# Patient Record
Sex: Male | Born: 1955 | Race: White | Hispanic: No | Marital: Married | Smoking: Never smoker
Health system: Southern US, Community
[De-identification: ages and names within clinical notes are randomized; demographics above are authoritative.]

## PROBLEM LIST (undated history)

## (undated) DIAGNOSIS — E785 Hyperlipidemia, unspecified: Secondary | ICD-10-CM

## (undated) DIAGNOSIS — I8390 Asymptomatic varicose veins of unspecified lower extremity: Secondary | ICD-10-CM

## (undated) DIAGNOSIS — M1990 Unspecified osteoarthritis, unspecified site: Secondary | ICD-10-CM

## (undated) HISTORY — PX: CATARACT EXTRACTION: SUR2

## (undated) HISTORY — PX: REPLACEMENT TOTAL HIP W/  RESURFACING IMPLANTS: SUR1222

## (undated) HISTORY — DX: Unspecified osteoarthritis, unspecified site: M19.90

## (undated) HISTORY — PX: VASECTOMY: SHX75

## (undated) HISTORY — DX: Asymptomatic varicose veins of unspecified lower extremity: I83.90

## (undated) HISTORY — DX: Hyperlipidemia, unspecified: E78.5

---

## 2004-09-20 ENCOUNTER — Emergency Department (HOSPITAL_COMMUNITY): Admission: EM | Admit: 2004-09-20 | Discharge: 2004-09-20 | Payer: Self-pay | Admitting: Emergency Medicine

## 2009-01-19 ENCOUNTER — Telehealth (INDEPENDENT_AMBULATORY_CARE_PROVIDER_SITE_OTHER): Payer: Self-pay | Admitting: *Deleted

## 2009-02-01 ENCOUNTER — Ambulatory Visit: Payer: Self-pay | Admitting: Internal Medicine

## 2009-02-01 DIAGNOSIS — I8390 Asymptomatic varicose veins of unspecified lower extremity: Secondary | ICD-10-CM | POA: Insufficient documentation

## 2009-02-01 DIAGNOSIS — D485 Neoplasm of uncertain behavior of skin: Secondary | ICD-10-CM | POA: Insufficient documentation

## 2009-02-01 DIAGNOSIS — M79609 Pain in unspecified limb: Secondary | ICD-10-CM | POA: Insufficient documentation

## 2009-03-16 ENCOUNTER — Ambulatory Visit: Payer: Self-pay | Admitting: Vascular Surgery

## 2009-07-08 ENCOUNTER — Ambulatory Visit: Payer: Self-pay | Admitting: Vascular Surgery

## 2009-08-10 ENCOUNTER — Ambulatory Visit: Payer: Self-pay | Admitting: Vascular Surgery

## 2009-08-10 HISTORY — PX: ENDOVENOUS ABLATION SAPHENOUS VEIN W/ LASER: SUR449

## 2009-08-17 ENCOUNTER — Ambulatory Visit: Payer: Self-pay | Admitting: Vascular Surgery

## 2010-03-09 ENCOUNTER — Ambulatory Visit: Payer: Self-pay | Admitting: Internal Medicine

## 2010-03-09 ENCOUNTER — Telehealth (INDEPENDENT_AMBULATORY_CARE_PROVIDER_SITE_OTHER): Payer: Self-pay | Admitting: *Deleted

## 2010-03-23 ENCOUNTER — Encounter: Payer: Self-pay | Admitting: Internal Medicine

## 2010-03-24 ENCOUNTER — Encounter: Payer: Self-pay | Admitting: Internal Medicine

## 2010-03-24 ENCOUNTER — Ambulatory Visit: Payer: Self-pay

## 2010-12-06 NOTE — Assessment & Plan Note (Signed)
Summary: FU---STC   Vital Signs:  Patient profile:   55 year old male Height:      62 inches Weight:      224.75 pounds BMI:     41.26 O2 Sat:      98 % on Room air Temp:     97.6 degrees F oral Pulse rate:   62 / minute BP sitting:   112 / 66  (left arm) Cuff size:   large  Vitals Entered By: Lucious Groves (Mar 09, 2010 7:58 AM)  O2 Flow:  Room air CC: F/U./kb Is Patient Diabetic? No Pain Assessment Patient in pain? no        CC:  F/U./kb.  History of Present Illness: C/o L leg hurts at L knee and lat shin spasming, sharp 4/10  Current Medications (verified): 1)  None  Allergies (verified): No Known Drug Allergies  Past History:  Past Medical History: Last updated: 02/01/2009 Unremarkable  Social History: Last updated: 02/01/2009 Occupation: Sports administrator Married, 2 children Never Smoked Alcohol use-yes Regular exercise-no  Physical Exam  General:  Well-developed,well-nourished,in no acute distress; alert,appropriate and cooperative throughout examination Mouth:  Oral mucosa and oropharynx without lesions or exudates.  Teeth in good repair. Lungs:  Normal respiratory effort, chest expands symmetrically. Lungs are clear to auscultation, no crackles or wheezes. Heart:  Normal rate and regular rhythm. S1 and S2 normal without gallop, murmur, click, rub or other extra sounds. Msk:  L leg is tender below knee lat.; no mass Extremities:  No edema. L LE w/large varicosities on posterior calf Neurologic:  No cranial nerve deficits noted. Station and gait are normal. Plantar reflexes are down-going bilaterally. DTRs are symmetrical throughout. Sensory, motor and coordinative functions appear intact. Skin:  SKs in B axillas No rash Psych:  Cognition and judgment appear intact. Alert and cooperative with normal attention span and concentration. No apparent delusions, illusions, hallucinations   Impression & Recommendations:  Problem # 1:  LEG PAIN, LEFT  (ICD-729.5) likely MSK. R/o other etiol Assessment New  Orders: T-Tib/Fib Left (73590TC) Doppler Referral (Doppler)  Complete Medication List: 1)  Aspirin 81 Mg Tbec (Aspirin) .Marland Kitchen.. 1 by mouth qd 2)  Tramadol Hcl 50 Mg Tabs (Tramadol hcl) .Marland Kitchen.. 1-2 tabs by mouth two times a day as needed pain 3)  Naproxen 500 Mg Tabs (Naproxen) .Marland Kitchen.. 1 by mouth two times a day pc for pain/arthritis x 2 wks then prn 4)  Vitamin D 1000 Unit Tabs (Cholecalciferol) .Marland Kitchen.. 1 by mouth qd  Patient Instructions: 1)  Use stretching and balance exercises that I have provided (15 min. or longer every day) 2)  Please schedule a follow-up appointment in 6 weeks. Prescriptions: NAPROXEN 500 MG TABS (NAPROXEN) 1 by mouth two times a day pc for pain/arthritis x 2 wks then prn  #60 x 3   Entered and Authorized by:   Tresa Garter MD   Signed by:   Tresa Garter MD on 03/09/2010   Method used:   Print then Give to Patient   RxID:   [id number] TRAMADOL HCL 50 MG TABS (TRAMADOL HCL) 1-2 tabs by mouth two times a day as needed pain  #120 x 3   Entered and Authorized by:   Tresa Garter MD   Signed by:   Tresa Garter MD on 03/09/2010   Method used:   Print then Give to Patient   RxID:   [id number]

## 2010-12-06 NOTE — Progress Notes (Signed)
----   Converted from flag ---- ---- 03/09/2010 9:05 AM, Edman Circle wrote: appt 5/20 @ 12:30  ---- 03/09/2010 8:18 AM, Dagoberto Reef wrote: Thanks  ---- 03/09/2010 8:11 AM, Georgina Quint Plotnikov MD wrote: The following orders have been entered for this patient and placed on Admin Hold:  Type:     Referral       Code:   Doppler Description:   Doppler Referral Order Date:   03/09/2010   Authorized By:   Tresa Garter MD Order #:   [id number] Clinical Notes:   L leg pain x months ------------------------------

## 2010-12-06 NOTE — Miscellaneous (Signed)
Summary: Orders Update  Clinical Lists Changes  Orders: Added new Test order of Venous Duplex Lower Extremity (Venous Duplex Lower) - Signed 

## 2011-03-20 NOTE — Procedures (Signed)
LOWER EXTREMITY VENOUS REFLUX EXAM   INDICATION:  Left lower extremity varicose veins.   EXAM:  Using color-flow imaging and pulse Doppler spectral analysis, the  left common femoral, superficial femoral, popliteal, posterior tibial,  greater and lesser saphenous veins were evaluated.  There is evidence  suggesting deep venous insufficiency in the left common femoral vein.   The left saphenofemoral junction is not competent.  The left GSV is not  competent with calibers as described below.   The left proximal short saphenous vein demonstrates competency.   GSV Diameter (used if found to be incompetent only)                                            Right    Left  Proximal Greater Saphenous Vein           cm       0.72 cm  Proximal-to-mid-thigh                     cm       0.79 cm  Mid thigh                                 cm       cm  Mid-distal thigh                          cm       cm  Distal thigh                              cm       1.1 cm  Knee                                      cm       0.75 cm   IMPRESSION:  1. Left greater saphenous vein reflux is identified with the calibers      as described above and on the attached work sheet.  2. The left greater saphenous vein is not tortuous.  3. The deep venous system is not competent at the left common femoral      vein level.  4. The left lesser saphenous vein is competent.   ___________________________________________  Larina Earthly, M.D.   CH/MEDQ  D:  03/16/2009  T:  03/16/2009  Job:  [id number]

## 2011-03-20 NOTE — Assessment & Plan Note (Signed)
OFFICE VISIT   [name], Wayne L  DOB:  09/01/1956                                       08/10/2009  ZOXWR#:[id number]   Patient presented today for laser ablation of his left great saphenous  vein and multiple stab phlebectomies of the varicosity at the level of  his knee and calf.  He had no immediate complication and was discharged  home.  Will be seen in 1 week for ultrasound and an office follow-up.   Larina Earthly, M.D.  Electronically Signed   TFE/MEDQ  D:  08/10/2009  T:  08/11/2009  Job:  [id number]

## 2011-03-20 NOTE — Procedures (Signed)
DUPLEX DEEP VENOUS EXAM - LOWER EXTREMITY   INDICATION:  Followup of a left great saphenous vein EVLT.   HISTORY:  Edema:  Yes.  Trauma/Surgery:  EVLT.  Pain:  Yes.  PE:  No.  Previous DVT:  No.  Anticoagulants:  No.  Other:   DUPLEX EXAM:                CFV   SFV   PopV  PTV    GSV                R  L  R  L  R  L  R   L  R  L  Thrombosis    o  o     o     o      o     +  Spontaneous   +  +     +     +      +     o  Phasic        +  +     +     +      +     o  Augmentation  +  +     +     +      +     o  Compressible  +  +     +     +      +     o  Competent     +  +     +     +      +     o   Legend:  + - yes  o - no  p - partial  D - decreased   IMPRESSION:  Left great saphenous vein ablated from saphenofemoral  junction to knee, bulging varicosity in posterior calf partially  compressible below which connects to medial great saphenous vein and to  an incompetent perforator vein in the mid posterior calf measuring 0.67  cm.  There is no evidence of deep venous thrombosis.    _____________________________  Larina Earthly, M.D.   CJ/MEDQ  D:  08/17/2009  T:  08/17/2009  Job:  [id number]

## 2011-03-20 NOTE — Consult Note (Signed)
NEW PATIENT CONSULTATION   Wayne [name], Wayne [name]  DOB:  1955-11-14                                       03/16/2009  EAVWU#:[id number]   The patient presents today for evaluation of left leg venous  hypertension and pain associated with this.  He is a very pleasant  healthy 55 year old gentleman with a several year history of  varicosities in his left thigh and calf.  He reports that over the past  1-2 years he has began having increasing pain specifically over the  varicosities.  He reports that this is a dull achy sensation.  This is  worse with standing and he does have a heavy sensation in his left leg  from his calf distally.  He does have mild swelling in the level of his  ankle.  He does wear non-prescription strength compression with little  help.  He elevates his legs when possible as well.  His prior history is  unremarkable.  He does not have any history of cardiac or other major  medical difficulties.  He does not have hypertension, elevated  cholesterol, does not have diabetes.  He does have a family history of  premature atherosclerotic disease in mother.   SOCIAL HISTORY:  He is married with 2 children.  He does not smoke,  drink alcohol.   REVIEW OF SYSTEMS:  Otherwise negative.  He has no medications and has  no drug allergies.   PHYSICAL EXAMINATION:  He is a well-developed, well-nourished white male  appearing his stated age of 42.  Blood pressure is 101/65, pulse 78,  respirations 18.  His radial pulses are 2+ and he has 2+ dorsalis pedis  pulses bilaterally.  He does have significant varicose veins in his left  leg extending from his medial thigh down into his posterior calf.  He  does not have any skin changes in his ankle.   He underwent noninvasive vascular laboratory studies in our office and  this reveals reflux throughout his left great saphenous vein from the  groin distally, this does extend into the large varicosities in his  posterior calf.  There is communication with the small saphenous vein  but no evidence of reflux in the small saphenous vein..  I discussed  this at length with Wayne [name].  I explained the courses of  conservative treatment with elevation, compression and ibuprofen for  discomfort.  He is fitted with thigh-high 20-mm to 30-mm graduated  compression stockings today and is instructed on their daily use.  We  will see him again in 3 months for continued discussion.  I did explain  potential for laser ablation and stab phlebectomy for relief of  symptoms, should conservative methods fail.   Larina Earthly, M.D.  Electronically Signed   TFE/MEDQ  D:  03/16/2009  T:  03/17/2009  Job:  [id number]   cc:   Georgina Quint. Plotnikov, MD

## 2011-03-20 NOTE — Assessment & Plan Note (Signed)
OFFICE VISIT   Wayne [name], Wayne [name]  DOB:  1956/03/24                                       07/08/2009  UXLKG#:[id number]   The patient presents today for continued followup of his left leg venous  hypertension and varicose veins.  He has been wearing his compression  garments for 3 months and elevates his legs when possible and also takes  ibuprofen for discomfort.  He reports he has had no relief from the  pain.  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  He has prolonged standing and walking difficulty due to  leg pain.  He also runs for exercise and has had to stop running due to  leg pain and swelling.  He also has severe itching over the areas of  varicosities as well.   I discussed options with the patient.  I feel he has clearly failed  conservative treatment.  He does have reflux about his left great  saphenous vein extending into the varicosities.  I have recommended  laser ablation of his left great saphenous vein and stab phlebectomy of  tributary varicosities.  We will proceed with this at his convenience.   Larina Earthly, M.D.  Electronically Signed   TFE/MEDQ  D:  07/08/2009  T:  07/09/2009  Job:  [id number]

## 2011-03-20 NOTE — Assessment & Plan Note (Signed)
OFFICE VISIT   [name], Wayne L  DOB:  1955/12/15                                       08/17/2009  QIONG#:[id number]   The patient presents today for 1 week followup of left great saphenous  vein laser ablation and stab phlebectomy of multiple tributaries  varicosities in his calf and thigh.  He did well with the procedure and  has the usual amount of erythema and induration over the ablation in his  thigh.  His incisions all look quite good.  He underwent ultrasound  today and this reveals ablation of his great saphenous vein with no  evidence of DVT.  I am quite pleased with his initial result and plan to  see him again in 6 weeks for continued followup.   Larina Earthly, M.D.  Electronically Signed   TFE/MEDQ  D:  08/17/2009  T:  08/18/2009  Job:  [id number]   cc:   Georgina Quint. Plotnikov, MD

## 2011-06-26 ENCOUNTER — Other Ambulatory Visit: Payer: Self-pay | Admitting: *Deleted

## 2011-06-26 DIAGNOSIS — Z0389 Encounter for observation for other suspected diseases and conditions ruled out: Secondary | ICD-10-CM

## 2011-06-26 DIAGNOSIS — Z0000 Encounter for general adult medical examination without abnormal findings: Secondary | ICD-10-CM

## 2011-06-27 ENCOUNTER — Other Ambulatory Visit (INDEPENDENT_AMBULATORY_CARE_PROVIDER_SITE_OTHER): Payer: Managed Care, Other (non HMO)

## 2011-06-27 ENCOUNTER — Other Ambulatory Visit: Payer: Self-pay

## 2011-06-27 ENCOUNTER — Other Ambulatory Visit: Payer: Self-pay | Admitting: Internal Medicine

## 2011-06-27 DIAGNOSIS — Z0000 Encounter for general adult medical examination without abnormal findings: Secondary | ICD-10-CM

## 2011-06-27 DIAGNOSIS — Z0389 Encounter for observation for other suspected diseases and conditions ruled out: Secondary | ICD-10-CM

## 2011-06-27 LAB — CBC WITH DIFFERENTIAL/PLATELET
Basophils Relative: 0.6 % (ref 0.0–3.0)
Eosinophils Absolute: 0.2 10*3/uL (ref 0.0–0.7)
HCT: 41 % (ref 39.0–52.0)
Hemoglobin: 13.8 g/dL (ref 13.0–17.0)
Lymphs Abs: 1.6 10*3/uL (ref 0.7–4.0)
MCHC: 33.8 g/dL (ref 30.0–36.0)
MCV: 90.4 fl (ref 78.0–100.0)
Monocytes Absolute: 0.5 10*3/uL (ref 0.1–1.0)
Neutro Abs: 3.3 10*3/uL (ref 1.4–7.7)
RBC: 4.53 Mil/uL (ref 4.22–5.81)

## 2011-06-27 LAB — HEPATIC FUNCTION PANEL
AST: 23 U/L (ref 0–37)
Albumin: 4.4 g/dL (ref 3.5–5.2)
Alkaline Phosphatase: 62 U/L (ref 39–117)
Bilirubin, Direct: 0.1 mg/dL (ref 0.0–0.3)
Total Protein: 6.8 g/dL (ref 6.0–8.3)

## 2011-06-27 LAB — URINALYSIS, ROUTINE W REFLEX MICROSCOPIC
Bilirubin Urine: NEGATIVE
Hgb urine dipstick: NEGATIVE
Ketones, ur: NEGATIVE
Leukocytes, UA: NEGATIVE
Specific Gravity, Urine: 1.02 (ref 1.000–1.030)
Total Protein, Urine: NEGATIVE
Urine Glucose: NEGATIVE

## 2011-06-27 LAB — BASIC METABOLIC PANEL
CO2: 27 mEq/L (ref 19–32)
GFR: 125.17 mL/min (ref >60.00)
Glucose, Bld: 89 mg/dL (ref 70–99)
Potassium: 4.3 mEq/L (ref 3.5–5.1)
Sodium: 142 mEq/L (ref 135–145)

## 2011-07-04 ENCOUNTER — Ambulatory Visit (INDEPENDENT_AMBULATORY_CARE_PROVIDER_SITE_OTHER): Payer: Managed Care, Other (non HMO) | Admitting: Internal Medicine

## 2011-07-04 ENCOUNTER — Encounter: Payer: Self-pay | Admitting: Internal Medicine

## 2011-07-04 VITALS — BP 120/60 | HR 68 | Temp 98.5°F | Resp 16 | Ht 74.0 in | Wt 227.0 lb

## 2011-07-04 DIAGNOSIS — E785 Hyperlipidemia, unspecified: Secondary | ICD-10-CM

## 2011-07-04 DIAGNOSIS — Z0000 Encounter for general adult medical examination without abnormal findings: Secondary | ICD-10-CM

## 2011-07-04 NOTE — Assessment & Plan Note (Signed)
As above Colonosc, tDAP

## 2011-07-04 NOTE — Progress Notes (Signed)
Subjective:    Patient ID: Wayne [name], male    DOB: 1955/12/03, 55 y.o.   MRN: [id number]  HPI The patient is here for a wellness exam. The patient has been doing well overall without major physical or psychological issues going on lately.  BP Readings from Last 3 Encounters:  07/04/11 150/92  03/09/10 112/66  02/01/09 110/66   Wt Readings from Last 3 Encounters:  07/04/11 227 lb (102.967 kg)  03/09/10 224 lb 12 oz (101.946 kg)  02/01/09 210 lb (95.255 kg)       Review of Systems  Constitutional: Positive for unexpected weight change. Negative for appetite change and fatigue.  HENT: Negative for nosebleeds, congestion, sore throat, sneezing, trouble swallowing and neck pain.   Eyes: Negative for itching and visual disturbance.  Respiratory: Negative for cough.   Cardiovascular: Negative for chest pain, palpitations and leg swelling.  Gastrointestinal: Negative for nausea, diarrhea, blood in stool and abdominal distention.  Genitourinary: Negative for frequency and hematuria.  Musculoskeletal: Negative for back pain, joint swelling and gait problem.  Skin: Negative for rash.  Neurological: Negative for dizziness, tremors, speech difficulty and weakness.  Psychiatric/Behavioral: Negative for sleep disturbance, dysphoric mood and agitation. The patient is not nervous/anxious.        Objective:   Physical Exam  Constitutional: He is oriented to person, place, and time. He appears well-developed and well-nourished. No distress.  HENT:  Head: Normocephalic and atraumatic.  Right Ear: External ear normal.  Left Ear: External ear normal.  Nose: Nose normal.  Mouth/Throat: Oropharynx is clear and moist. No oropharyngeal exudate.  Eyes: Conjunctivae and EOM are normal. Pupils are equal, round, and reactive to light. Right eye exhibits no discharge. Left eye exhibits no discharge. No scleral icterus.  Neck: Normal range of motion. Neck supple. No JVD present. No tracheal  deviation present. No thyromegaly present.  Cardiovascular: Normal rate, regular rhythm, normal heart sounds and intact distal pulses.  Exam reveals no gallop and no friction rub.   No murmur heard. Pulmonary/Chest: Effort normal and breath sounds normal. No stridor. No respiratory distress. He has no wheezes. He has no rales. He exhibits no tenderness.  Abdominal: Soft. Bowel sounds are normal. He exhibits no distension and no mass. There is no tenderness. There is no rebound and no guarding.  Genitourinary: Rectum normal, prostate normal and penis normal. Guaiac negative stool. No penile tenderness.  Musculoskeletal: Normal range of motion. He exhibits no edema and no tenderness.  Lymphadenopathy:    He has no cervical adenopathy.  Neurological: He is alert and oriented to person, place, and time. He has normal reflexes. No cranial nerve deficit. He exhibits normal muscle tone. Coordination normal.  Skin: Skin is warm and dry. No rash noted. He is not diaphoretic. No erythema. No pallor.  Psychiatric: He has a normal mood and affect. His behavior is normal. Judgment and thought content normal.      Lab Results  Component Value Date   WBC 5.5 06/27/2011   HGB 13.8 06/27/2011   HCT 41.0 06/27/2011   PLT 220.0 06/27/2011   CHOL 215* 06/27/2011   TRIG 63.0 06/27/2011   HDL 44.70 06/27/2011   LDLDIRECT 162.7 06/27/2011   ALT 28 06/27/2011   AST 23 06/27/2011   NA 142 06/27/2011   K 4.3 06/27/2011   CL 107 06/27/2011   CREATININE 0.7 06/27/2011   BUN 13 06/27/2011   CO2 27 06/27/2011   TSH 1.58 06/27/2011   PSA 0.34 06/27/2011  Assessment & Plan:

## 2011-07-04 NOTE — Assessment & Plan Note (Signed)
Wt loss. Good diet

## 2011-09-11 ENCOUNTER — Encounter: Payer: Self-pay | Admitting: Gastroenterology

## 2012-05-20 ENCOUNTER — Encounter: Payer: Self-pay | Admitting: Internal Medicine

## 2012-05-20 ENCOUNTER — Ambulatory Visit (INDEPENDENT_AMBULATORY_CARE_PROVIDER_SITE_OTHER)
Admission: RE | Admit: 2012-05-20 | Discharge: 2012-05-20 | Disposition: A | Discharge status: Home / Self Care | Payer: BC Managed Care – PPO | Source: Ambulatory Visit | Attending: Internal Medicine | Admitting: Internal Medicine

## 2012-05-20 ENCOUNTER — Ambulatory Visit (INDEPENDENT_AMBULATORY_CARE_PROVIDER_SITE_OTHER): Payer: BC Managed Care – PPO | Admitting: Internal Medicine

## 2012-05-20 VITALS — BP 120/80 | HR 80 | Temp 98.2°F | Resp 16 | Wt 227.0 lb

## 2012-05-20 DIAGNOSIS — M542 Cervicalgia: Secondary | ICD-10-CM

## 2012-05-20 MED ORDER — TRAMADOL HCL 50 MG PO TABS: 50.0000 mg | ORAL_TABLET | Freq: Two times a day (BID) | ORAL | Status: AC | PRN

## 2012-05-20 MED ORDER — IBUPROFEN 600 MG PO TABS: ORAL_TABLET | ORAL | Status: AC

## 2012-05-20 NOTE — Patient Instructions (Addendum)
Contour pillow  

## 2012-05-20 NOTE — Assessment & Plan Note (Addendum)
X ray See meds ROM exercises

## 2012-05-21 ENCOUNTER — Telehealth: Payer: Self-pay | Admitting: Internal Medicine

## 2012-05-21 NOTE — Telephone Encounter (Signed)
Left detailed mess informing pt of below.  

## 2012-05-21 NOTE — Progress Notes (Signed)
Subjective:    Patient ID: Wayne [name], male    DOB: 1956-06-04, 56 y.o.   MRN: [id number]  Neck Pain  This is a new problem. The current episode started 1 to 4 weeks ago. The problem occurs daily. The problem has been gradually worsening. The pain is associated with a sleep position. Pain location: B sides. The quality of the pain is described as stabbing and aching. The pain is at a severity of 5/10. The pain is moderate. The symptoms are aggravated by position. Pertinent negatives include no chest pain, fever, trouble swallowing or weakness. He has tried acetaminophen and NSAIDs for the symptoms. The treatment provided mild relief.     BP Readings from Last 3 Encounters:  05/20/12 120/80  07/04/11 120/60  03/09/10 112/66   Wt Readings from Last 3 Encounters:  05/20/12 227 lb (102.967 kg)  07/04/11 227 lb (102.967 kg)  03/09/10 224 lb 12 oz (101.946 kg)       Review of Systems  Constitutional: Positive for unexpected weight change. Negative for fever, appetite change and fatigue.  HENT: Positive for neck pain. Negative for nosebleeds, congestion, sore throat, sneezing and trouble swallowing.   Eyes: Negative for itching and visual disturbance.  Respiratory: Negative for cough.   Cardiovascular: Negative for chest pain, palpitations and leg swelling.  Gastrointestinal: Negative for nausea, diarrhea, blood in stool and abdominal distention.  Genitourinary: Negative for frequency and hematuria.  Musculoskeletal: Negative for back pain, joint swelling and gait problem.  Skin: Negative for rash.  Neurological: Negative for dizziness, tremors, speech difficulty and weakness.  Psychiatric/Behavioral: Negative for disturbed wake/sleep cycle, dysphoric mood and agitation. The patient is not nervous/anxious.        Objective:   Physical Exam  Constitutional: He is oriented to person, place, and time. He appears well-developed and well-nourished. No distress.  HENT:  Head:  Normocephalic and atraumatic.  Right Ear: External ear normal.  Left Ear: External ear normal.  Nose: Nose normal.  Mouth/Throat: Oropharynx is clear and moist. No oropharyngeal exudate.  Eyes: Conjunctivae and EOM are normal. Pupils are equal, round, and reactive to light. Right eye exhibits no discharge. Left eye exhibits no discharge. No scleral icterus.  Neck: Normal range of motion. Neck supple. No JVD present. No tracheal deviation present. No thyromegaly present.  Cardiovascular: Normal rate, regular rhythm, normal heart sounds and intact distal pulses.  Exam reveals no gallop and no friction rub.   No murmur heard. Pulmonary/Chest: Effort normal and breath sounds normal. No stridor. No respiratory distress. He has no wheezes. He has no rales. He exhibits no tenderness.  Abdominal: Soft. Bowel sounds are normal. He exhibits no distension and no mass. There is no tenderness. There is no rebound and no guarding.  Genitourinary: Rectum normal, prostate normal and penis normal. Guaiac negative stool. No penile tenderness.  Musculoskeletal: Normal range of motion. He exhibits no edema and no tenderness.  Lymphadenopathy:    He has no cervical adenopathy.  Neurological: He is alert and oriented to person, place, and time. He has normal reflexes. No cranial nerve deficit. He exhibits normal muscle tone. Coordination normal.  Skin: Skin is warm and dry. No rash noted. He is not diaphoretic. No erythema. No pallor.  Psychiatric: He has a normal mood and affect. His behavior is normal. Judgment and thought content normal.  B traps are tender    Lab Results  Component Value Date   WBC 5.5 06/27/2011   HGB 13.8 06/27/2011   HCT  41.0 06/27/2011   PLT 220.0 06/27/2011   CHOL 215* 06/27/2011   TRIG 63.0 06/27/2011   HDL 44.70 06/27/2011   LDLDIRECT 162.7 06/27/2011   ALT 28 06/27/2011   AST 23 06/27/2011   NA 142 06/27/2011   K 4.3 06/27/2011   CL 107 06/27/2011   CREATININE 0.7 06/27/2011   BUN 13  06/27/2011   CO2 27 06/27/2011   TSH 1.58 06/27/2011   PSA 0.34 06/27/2011       Assessment & Plan:

## 2012-05-21 NOTE — Telephone Encounter (Signed)
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 Rx as we discussed Thx

## 2012-10-17 ENCOUNTER — Ambulatory Visit (INDEPENDENT_AMBULATORY_CARE_PROVIDER_SITE_OTHER)
Admission: RE | Admit: 2012-10-17 | Discharge: 2012-10-17 | Disposition: A | Discharge status: Home / Self Care | Payer: BC Managed Care – PPO | Source: Ambulatory Visit | Attending: Internal Medicine | Admitting: Internal Medicine

## 2012-10-17 ENCOUNTER — Ambulatory Visit (INDEPENDENT_AMBULATORY_CARE_PROVIDER_SITE_OTHER): Payer: BC Managed Care – PPO | Admitting: Internal Medicine

## 2012-10-17 ENCOUNTER — Encounter: Payer: Self-pay | Admitting: Internal Medicine

## 2012-10-17 VITALS — BP 138/78 | HR 76 | Temp 97.5°F | Resp 16 | Wt 229.0 lb

## 2012-10-17 DIAGNOSIS — M79609 Pain in unspecified limb: Secondary | ICD-10-CM

## 2012-10-17 DIAGNOSIS — M542 Cervicalgia: Secondary | ICD-10-CM

## 2012-10-17 MED ORDER — IBUPROFEN 600 MG PO TABS: 600.0000 mg | ORAL_TABLET | Freq: Three times a day (TID) | ORAL | Status: DC | PRN

## 2012-10-17 NOTE — Assessment & Plan Note (Signed)
Continue with current prescription therapy as reflected on the Med list.  

## 2012-10-17 NOTE — Progress Notes (Signed)
Subjective:    Patient ID: Wayne [name], male    DOB: [birth year], 56 y.o.   MRN: [id number]  Leg Pain  There was no injury mechanism. The pain is present in the left leg, left knee and left thigh. The pain is moderate. Pertinent negatives include no loss of motion or muscle weakness. Exacerbated by: at night. The treatment provided mild relief.  Neck Pain  This is a new problem. The current episode started 1 to 4 weeks ago. The problem occurs daily. The problem has been gradually worsening. The pain is associated with a sleep position. Pain location: B sides. The quality of the pain is described as stabbing and aching. The pain is at a severity of 5/10. The pain is mild. The symptoms are aggravated by position. Associated symptoms include leg pain. Pertinent negatives include no chest pain, fever, trouble swallowing or weakness. He has tried acetaminophen and NSAIDs for the symptoms. The treatment provided mild relief.     BP Readings from Last 3 Encounters:  10/17/12 138/78  05/20/12 120/80  07/04/11 120/60   Wt Readings from Last 3 Encounters:  10/17/12 229 lb (103.874 kg)  05/20/12 227 lb (102.967 kg)  07/04/11 227 lb (102.967 kg)       Review of Systems  Constitutional: Positive for unexpected weight change. Negative for fever, appetite change and fatigue.  HENT: Positive for neck pain. Negative for nosebleeds, congestion, sore throat, sneezing and trouble swallowing.   Eyes: Negative for itching and visual disturbance.  Respiratory: Negative for cough.   Cardiovascular: Negative for chest pain, palpitations and leg swelling.  Gastrointestinal: Negative for nausea, diarrhea, blood in stool and abdominal distention.  Genitourinary: Negative for frequency and hematuria.  Musculoskeletal: Negative for back pain, joint swelling and gait problem.  Skin: Negative for rash.  Neurological: Negative for dizziness, tremors, speech difficulty and weakness.  Psychiatric/Behavioral:  Negative for sleep disturbance, dysphoric mood and agitation. The patient is not nervous/anxious.        Objective:   Physical Exam  Constitutional: He is oriented to person, place, and time. He appears well-developed and well-nourished. No distress.  HENT:  Head: Normocephalic and atraumatic.  Right Ear: External ear normal.  Left Ear: External ear normal.  Nose: Nose normal.  Mouth/Throat: Oropharynx is clear and moist. No oropharyngeal exudate.  Eyes: Conjunctivae normal and EOM are normal. Pupils are equal, round, and reactive to light. Right eye exhibits no discharge. Left eye exhibits no discharge. No scleral icterus.  Neck: Normal range of motion. Neck supple. No JVD present. No tracheal deviation present. No thyromegaly present.  Cardiovascular: Normal rate, regular rhythm, normal heart sounds and intact distal pulses.  Exam reveals no gallop and no friction rub.   No murmur heard. Pulmonary/Chest: Effort normal and breath sounds normal. No stridor. No respiratory distress. He has no wheezes. He has no rales. He exhibits no tenderness.  Abdominal: Soft. Bowel sounds are normal. He exhibits no distension and no mass. There is no tenderness. There is no rebound and no guarding.  Genitourinary: Rectum normal, prostate normal and penis normal. Guaiac negative stool. No penile tenderness.  Musculoskeletal: Normal range of motion. He exhibits no edema and no tenderness.  Lymphadenopathy:    He has no cervical adenopathy.  Neurological: He is alert and oriented to person, place, and time. He has normal reflexes. No cranial nerve deficit. He exhibits normal muscle tone. Coordination normal.  Skin: Skin is warm and dry. No rash noted. He is not diaphoretic. No  erythema. No pallor.  Psychiatric: He has a normal mood and affect. His behavior is normal. Judgment and thought content normal.  B traps are tender Tight hamstrings B    Lab Results  Component Value Date   WBC 5.5 06/27/2011    HGB 13.8 06/27/2011   HCT 41.0 06/27/2011   PLT 220.0 06/27/2011   CHOL 215* 06/27/2011   TRIG 63.0 06/27/2011   HDL 44.70 06/27/2011   LDLDIRECT 162.7 06/27/2011   ALT 28 06/27/2011   AST 23 06/27/2011   NA 142 06/27/2011   K 4.3 06/27/2011   CL 107 06/27/2011   CREATININE 0.7 06/27/2011   BUN 13 06/27/2011   CO2 27 06/27/2011   TSH 1.58 06/27/2011   PSA 0.34 06/27/2011       Assessment & Plan:

## 2012-10-17 NOTE — Assessment & Plan Note (Signed)
12/13 -- ?tight hamstring L knee Xray Stretch

## 2013-02-20 ENCOUNTER — Encounter: Payer: Self-pay | Admitting: Internal Medicine

## 2013-02-20 ENCOUNTER — Ambulatory Visit (INDEPENDENT_AMBULATORY_CARE_PROVIDER_SITE_OTHER): Payer: BC Managed Care – PPO | Admitting: Internal Medicine

## 2013-02-20 VITALS — BP 120/70 | Temp 97.7°F | Wt 229.0 lb

## 2013-02-20 DIAGNOSIS — I8390 Asymptomatic varicose veins of unspecified lower extremity: Secondary | ICD-10-CM

## 2013-02-20 DIAGNOSIS — I8392 Asymptomatic varicose veins of left lower extremity: Secondary | ICD-10-CM

## 2013-02-20 DIAGNOSIS — Z0000 Encounter for general adult medical examination without abnormal findings: Secondary | ICD-10-CM

## 2013-02-20 DIAGNOSIS — L089 Local infection of the skin and subcutaneous tissue, unspecified: Secondary | ICD-10-CM | POA: Insufficient documentation

## 2013-02-20 DIAGNOSIS — M79609 Pain in unspecified limb: Secondary | ICD-10-CM

## 2013-02-20 DIAGNOSIS — M542 Cervicalgia: Secondary | ICD-10-CM

## 2013-02-20 DIAGNOSIS — L723 Sebaceous cyst: Secondary | ICD-10-CM | POA: Insufficient documentation

## 2013-02-20 DIAGNOSIS — L219 Seborrheic dermatitis, unspecified: Secondary | ICD-10-CM | POA: Insufficient documentation

## 2013-02-20 MED ORDER — IBUPROFEN 600 MG PO TABS: 600.0000 mg | ORAL_TABLET | Freq: Three times a day (TID) | ORAL | Status: DC | PRN

## 2013-02-20 MED ORDER — DOXYCYCLINE HYCLATE 100 MG PO TABS: 100.0000 mg | ORAL_TABLET | Freq: Two times a day (BID) | ORAL | Status: DC

## 2013-02-20 MED ORDER — CLOTRIMAZOLE-BETAMETHASONE 1-0.05 % EX CREA: TOPICAL_CREAM | Freq: Two times a day (BID) | CUTANEOUS | Status: DC

## 2013-02-20 NOTE — Assessment & Plan Note (Signed)
4/13 Varic veins relapse (?) Dr Arbie Cookey

## 2013-02-20 NOTE — Assessment & Plan Note (Signed)
Doxy

## 2013-02-20 NOTE — Progress Notes (Signed)
Subjective:    Patient ID: Wayne [name], male    DOB: [birth year], 57 y.o.   MRN: [id number]  Leg Pain  There was no injury mechanism. The pain is present in the left leg, left knee and left thigh. The pain is moderate. Pertinent negatives include no loss of motion or muscle weakness. Exacerbated by: at night. The treatment provided mild relief.   C/o rash on face C/o skin swelling - ?cyst  BP Readings from Last 3 Encounters:  02/20/13 120/70  10/17/12 138/78  05/20/12 120/80   Wt Readings from Last 3 Encounters:  02/20/13 229 lb (103.874 kg)  10/17/12 229 lb (103.874 kg)  05/20/12 227 lb (102.967 kg)       Review of Systems  Constitutional: Positive for unexpected weight change. Negative for appetite change and fatigue.  HENT: Negative for nosebleeds, congestion, sore throat and sneezing.   Eyes: Negative for itching and visual disturbance.  Respiratory: Negative for cough.   Cardiovascular: Negative for palpitations and leg swelling.  Gastrointestinal: Negative for nausea, diarrhea, blood in stool and abdominal distention.  Genitourinary: Negative for frequency and hematuria.  Musculoskeletal: Negative for back pain, joint swelling and gait problem.  Skin: Negative for rash.  Neurological: Negative for dizziness, tremors and speech difficulty.  Psychiatric/Behavioral: Negative for sleep disturbance, dysphoric mood and agitation. The patient is not nervous/anxious.        Objective:   Physical Exam  Constitutional: He is oriented to person, place, and time. He appears well-developed and well-nourished. No distress.  HENT:  Head: Normocephalic and atraumatic.  Right Ear: External ear normal.  Left Ear: External ear normal.  Nose: Nose normal.  Mouth/Throat: Oropharynx is clear and moist. No oropharyngeal exudate.  Eyes: Conjunctivae and EOM are normal. Pupils are equal, round, and reactive to light. Right eye exhibits no discharge. Left eye exhibits no discharge. No  scleral icterus.  Neck: Normal range of motion. Neck supple. No JVD present. No tracheal deviation present. No thyromegaly present.  Cardiovascular: Normal rate, regular rhythm, normal heart sounds and intact distal pulses.  Exam reveals no gallop and no friction rub.   No murmur heard. Pulmonary/Chest: Effort normal and breath sounds normal. No stridor. No respiratory distress. He has no wheezes. He has no rales. He exhibits no tenderness.  Abdominal: Soft. Bowel sounds are normal. He exhibits no distension and no mass. There is no tenderness. There is no rebound and no guarding.  Genitourinary: Rectum normal, prostate normal and penis normal. Guaiac negative stool. No penile tenderness.  Musculoskeletal: Normal range of motion. He exhibits no edema and no tenderness.  Lymphadenopathy:    He has no cervical adenopathy.  Neurological: He is alert and oriented to person, place, and time. He has normal reflexes. No cranial nerve deficit. He exhibits normal muscle tone. Coordination normal.  Skin: Skin is warm and dry. No rash noted. He is not diaphoretic. No erythema. No pallor.  Psychiatric: He has a normal mood and affect. His behavior is normal. Judgment and thought content normal.  B traps are tender Tight hamstrings B    Lab Results  Component Value Date   WBC 5.5 06/27/2011   HGB 13.8 06/27/2011   HCT 41.0 06/27/2011   PLT 220.0 06/27/2011   CHOL 215* 06/27/2011   TRIG 63.0 06/27/2011   HDL 44.70 06/27/2011   LDLDIRECT 162.7 06/27/2011   ALT 28 06/27/2011   AST 23 06/27/2011   NA 142 06/27/2011   K 4.3 06/27/2011   CL 107  06/27/2011   CREATININE 0.7 06/27/2011   BUN 13 06/27/2011   CO2 27 06/27/2011   TSH 1.58 06/27/2011   PSA 0.34 06/27/2011       Assessment & Plan:

## 2013-02-20 NOTE — Assessment & Plan Note (Signed)
Lotrisone prn 

## 2013-02-20 NOTE — Assessment & Plan Note (Signed)
Better  

## 2013-02-20 NOTE — Assessment & Plan Note (Signed)
Will consult Dr Arbie Cookey

## 2013-02-22 ENCOUNTER — Encounter: Payer: Self-pay | Admitting: Internal Medicine

## 2013-03-02 ENCOUNTER — Encounter: Payer: Self-pay | Admitting: Internal Medicine

## 2013-03-02 ENCOUNTER — Ambulatory Visit (INDEPENDENT_AMBULATORY_CARE_PROVIDER_SITE_OTHER): Payer: BC Managed Care – PPO | Admitting: Internal Medicine

## 2013-03-02 VITALS — BP 130/70 | HR 65 | Temp 97.4°F | Wt 227.0 lb

## 2013-03-02 DIAGNOSIS — R1310 Dysphagia, unspecified: Secondary | ICD-10-CM

## 2013-03-02 DIAGNOSIS — J029 Acute pharyngitis, unspecified: Secondary | ICD-10-CM

## 2013-03-02 DIAGNOSIS — F458 Other somatoform disorders: Secondary | ICD-10-CM

## 2013-03-02 DIAGNOSIS — R198 Other specified symptoms and signs involving the digestive system and abdomen: Secondary | ICD-10-CM

## 2013-03-02 DIAGNOSIS — R0989 Other specified symptoms and signs involving the circulatory and respiratory systems: Secondary | ICD-10-CM

## 2013-03-02 NOTE — Progress Notes (Signed)
HPI  Pt presents to the clinic today with c/o sore throat and difficulty swallowing. He was seen 2 weeks ago by Dr. Posey Rea and given doxycycline for the sore throat. He does have a few pills left but her reports the sore throat is not any better. He has not taking anything over the counter. He does feel like food gets stuck when he tries to swallow. He has never had trouble swallowing in the past. He has never had a upper GI scope or a colonoscopy. He has no family history of throat/mouth cancer. He does not smoke.  Review of Systems     History reviewed. No pertinent past medical history.  Family History  Problem Relation Age of Onset  . Diabetes Father   . Hypertension Mother     History   Social History  . Marital Status: Married    Spouse Name: N/A    Number of Children: 2  . Years of Education: N/A   Occupational History  . Not on file.   Social History Main Topics  . Smoking status: Never Smoker   . Smokeless tobacco: Not on file  . Alcohol Use: 1.8 oz/week    3 Cans of beer per week  . Drug Use: No  . Sexually Active: Yes   Other Topics Concern  . Not on file   Social History Narrative   Regular Exercise- no    No Known Allergies   Constitutional:  Denies headache, fatigue, fever or abrupt weight changes.  HEENT:  Positive sore throat. Denies eye redness, eye pain, pressure behind the eyes, facial pain, nasal congestion, ear pain, ringing in the ears, wax buildup, runny nose or bloody nose. Respiratory:  Denies cough, difficulty breathing or shortness of breath.  Cardiovascular: Denies chest pain, chest tightness, palpitations or swelling in the hands or feet.   No other specific complaints in a complete review of systems (except as listed in HPI above).  Objective:   BP 130/70  Pulse 65  Temp(Src) 97.4 F (36.3 C) (Oral)  Wt 227 lb (102.967 kg)  BMI 29.13 kg/m2  SpO2 97% Wt Readings from Last 3 Encounters:  03/02/13 227 lb (102.967 kg)   02/20/13 229 lb (103.874 kg)  10/17/12 229 lb (103.874 kg)     General: Appears his stated age, well developed, well nourished in NAD. HEENT: Head: normal shape and size; Eyes: sclera white, no icterus, conjunctiva pink, PERRLA and EOMs intact; Ears: Tm's gray and intact, normal light reflex; Nose: mucosa pink and moist, septum midline; Throat/Mouth: Teeth present, mucosa pink and moist, no exudate noted, no lesions or ulcerations noted.  Neck:  Neck supple, trachea midline. No massses, lumps or thyromegaly present.  Cardiovascular: Normal rate and rhythm. S1,S2 noted.  No murmur, rubs or gallops noted. No JVD or BLE edema. No carotid bruits noted. Pulmonary/Chest: Normal effort and positive vesicular breath sounds. No respiratory distress. No wheezes, rales or ronchi noted.      Assessment & Plan:   Sore throat with globus sensation and dysphagia, new onset:  No sign of bacterial infection to treat Nothing obvious stuck in the throat Will refer to GI for possible scope  RTC as needed or if symptoms persist.

## 2013-03-02 NOTE — Patient Instructions (Signed)

## 2013-03-05 ENCOUNTER — Other Ambulatory Visit: Payer: Self-pay | Admitting: *Deleted

## 2013-03-05 ENCOUNTER — Telehealth: Payer: Self-pay

## 2013-03-05 ENCOUNTER — Other Ambulatory Visit: Payer: Self-pay | Admitting: Internal Medicine

## 2013-03-05 DIAGNOSIS — I83893 Varicose veins of bilateral lower extremities with other complications: Secondary | ICD-10-CM

## 2013-03-05 DIAGNOSIS — M79609 Pain in unspecified limb: Secondary | ICD-10-CM

## 2013-03-05 MED ORDER — SCOPOLAMINE 1 MG/3DAYS TD PT72: 1.0000 | MEDICATED_PATCH | TRANSDERMAL | Status: DC

## 2013-03-05 NOTE — Telephone Encounter (Signed)
Pt called requesting a Rx for motion sickness patches for an upcoming cruise.

## 2013-03-05 NOTE — Telephone Encounter (Signed)
RX sent into [facility]

## 2013-03-09 ENCOUNTER — Encounter: Payer: BC Managed Care – PPO | Admitting: Vascular Surgery

## 2013-03-26 ENCOUNTER — Encounter: Payer: Self-pay | Admitting: Vascular Surgery

## 2013-03-27 ENCOUNTER — Encounter: Payer: BC Managed Care – PPO | Admitting: Vascular Surgery

## 2013-03-31 ENCOUNTER — Encounter: Payer: BC Managed Care – PPO | Admitting: Vascular Surgery

## 2013-04-27 ENCOUNTER — Encounter: Payer: Self-pay | Admitting: Vascular Surgery

## 2013-04-28 ENCOUNTER — Encounter: Payer: BC Managed Care – PPO | Admitting: Vascular Surgery

## 2013-05-05 ENCOUNTER — Other Ambulatory Visit (INDEPENDENT_AMBULATORY_CARE_PROVIDER_SITE_OTHER): Payer: BC Managed Care – PPO

## 2013-05-05 DIAGNOSIS — I8392 Asymptomatic varicose veins of left lower extremity: Secondary | ICD-10-CM

## 2013-05-05 DIAGNOSIS — M542 Cervicalgia: Secondary | ICD-10-CM

## 2013-05-05 DIAGNOSIS — M79609 Pain in unspecified limb: Secondary | ICD-10-CM

## 2013-05-05 DIAGNOSIS — I8390 Asymptomatic varicose veins of unspecified lower extremity: Secondary | ICD-10-CM

## 2013-05-05 DIAGNOSIS — L219 Seborrheic dermatitis, unspecified: Secondary | ICD-10-CM

## 2013-05-05 DIAGNOSIS — L723 Sebaceous cyst: Secondary | ICD-10-CM

## 2013-05-05 DIAGNOSIS — Z0000 Encounter for general adult medical examination without abnormal findings: Secondary | ICD-10-CM

## 2013-05-05 LAB — BASIC METABOLIC PANEL
BUN: 14 mg/dL (ref 6–23)
CO2: 31 mEq/L (ref 19–32)
GFR: 120.33 mL/min (ref >60.00)
Glucose, Bld: 95 mg/dL (ref 70–99)
Potassium: 4.5 mEq/L (ref 3.5–5.1)
Sodium: 139 mEq/L (ref 135–145)

## 2013-05-05 LAB — CBC WITH DIFFERENTIAL/PLATELET
Basophils Absolute: 0 10*3/uL (ref 0.0–0.1)
Eosinophils Absolute: 0.2 10*3/uL (ref 0.0–0.7)
HCT: 41.2 % (ref 39.0–52.0)
Hemoglobin: 13.7 g/dL (ref 13.0–17.0)
Lymphs Abs: 1.7 10*3/uL (ref 0.7–4.0)
MCHC: 33.3 g/dL (ref 30.0–36.0)
MCV: 91.5 fl (ref 78.0–100.0)
Monocytes Absolute: 0.5 10*3/uL (ref 0.1–1.0)
Monocytes Relative: 8.2 % (ref 3.0–12.0)
Neutro Abs: 3.2 10*3/uL (ref 1.4–7.7)
Platelets: 231 10*3/uL (ref 150.0–400.0)
RDW: 13 % (ref 11.5–14.6)

## 2013-05-05 LAB — HEPATIC FUNCTION PANEL
ALT: 23 U/L (ref 0–53)
AST: 24 U/L (ref 0–37)
Albumin: 4.1 g/dL (ref 3.5–5.2)
Alkaline Phosphatase: 64 U/L (ref 39–117)
Bilirubin, Direct: 0.1 mg/dL (ref 0.0–0.3)
Total Protein: 6.7 g/dL (ref 6.0–8.3)

## 2013-05-05 LAB — URINALYSIS
Bilirubin Urine: NEGATIVE
Hgb urine dipstick: NEGATIVE
Nitrite: NEGATIVE
Total Protein, Urine: NEGATIVE
Urine Glucose: NEGATIVE
pH: 7 (ref 5.0–8.0)

## 2013-05-05 LAB — LIPID PANEL: Cholesterol: 218 mg/dL — ABNORMAL HIGH (ref 0–200)

## 2013-06-10 ENCOUNTER — Telehealth: Payer: Self-pay

## 2013-06-10 NOTE — Telephone Encounter (Signed)
Phone call from patient on triage line requesting a call back regarding blood work and a form that needs to be filled out.

## 2013-06-10 NOTE — Telephone Encounter (Signed)
Left mess for patient to call back.  

## 2013-06-16 DIAGNOSIS — Z0279 Encounter for issue of other medical certificate: Secondary | ICD-10-CM

## 2013-06-19 ENCOUNTER — Encounter: Payer: Self-pay | Admitting: Surgery

## 2013-06-22 ENCOUNTER — Encounter: Payer: Self-pay | Admitting: Surgery

## 2013-06-22 ENCOUNTER — Encounter (INDEPENDENT_AMBULATORY_CARE_PROVIDER_SITE_OTHER): Payer: BC Managed Care – PPO | Admitting: Vascular Surgery

## 2013-06-22 ENCOUNTER — Ambulatory Visit (INDEPENDENT_AMBULATORY_CARE_PROVIDER_SITE_OTHER): Payer: BC Managed Care – PPO | Admitting: Surgery

## 2013-06-22 VITALS — BP 126/63 | HR 62 | Resp 16 | Ht 74.0 in | Wt 222.0 lb

## 2013-06-22 DIAGNOSIS — I83893 Varicose veins of bilateral lower extremities with other complications: Secondary | ICD-10-CM

## 2013-06-22 DIAGNOSIS — M7989 Other specified soft tissue disorders: Secondary | ICD-10-CM

## 2013-06-22 DIAGNOSIS — M79609 Pain in unspecified limb: Secondary | ICD-10-CM

## 2013-06-22 NOTE — Progress Notes (Signed)
[facility] [place]   Patient name: Wayne [name] MRN: [id number] DOB: Jun 06, 1956 Sex: male     Chief Complaint  Patient presents with  . New Evaluation    c/o left leg pain for 6 months,  s/p endovenous laser ablation left GSV 08-10-2013  . Varicose Veins    HISTORY OF PRESENT ILLNESS: The patient comes in with complaints of painful left varicose veins. He has a history of left great saphenous vein ablation in October of 2010. At that time he underwent between 10 and 22 stab phlebectomy's. He states that for the past 6 months he has noted discomfort and throbbing within his left upper thigh. He has also developed bulging varicose veins in this area. He treats his symptoms with ibuprofen which is marginally helpful. He does not complain of a significant amount of swelling. He has no bleeding episodes. He has not been wearing compression stockings. He does not have a history of DVT. His symptoms are aggravated by prolonged standing which happens on a regular basis.  Past Medical History  Diagnosis Date  . Arthritis   . Varicose veins     Past Surgical History  Procedure Laterality Date  . Endovenous ablation saphenous vein w/ laser Left 08-10-2009    left greater saphenous vein  . Vasectomy      History   Social History  . Marital Status: Married    Spouse Name: N/A    Number of Children: 2  . Years of Education: N/A   Occupational History  . Not on file.   Social History Main Topics  . Smoking status: Never Smoker   . Smokeless tobacco: Not on file  . Alcohol Use: 1.8 oz/week    3 Cans of beer per week  . Drug Use: No  . Sexual Activity: Yes   Other Topics Concern  . Not on file   Social History Narrative   Regular Exercise- no    Family History  Problem Relation Age of Onset  . Diabetes Father   . Hypertension Mother     Allergies as of 06/22/2013  . (No Known Allergies)    Current Outpatient Prescriptions on File Prior to Visit   Medication Sig Dispense Refill  . aspirin 81 MG tablet Take 81 mg by mouth daily.        . clotrimazole-betamethasone (LOTRISONE) cream Apply topically 2 (two) times daily. To flaky skin patches  45 g  1  . ibuprofen (ADVIL,MOTRIN) 600 MG tablet Take 1 tablet (600 mg total) by mouth every 8 (eight) hours as needed for pain.  60 tablet  5  . Cholecalciferol 1000 UNITS tablet Take 1,000 Units by mouth daily.        Marland Kitchen doxycycline (VIBRA-TABS) 100 MG tablet Take 1 tablet (100 mg total) by mouth 2 (two) times daily.  20 tablet  0  . scopolamine (TRANSDERM-SCOP) 1.5 MG Place 1 patch (1.5 mg total) onto the skin every 3 (three) days.  10 patch  12   No current facility-administered medications on file prior to visit.     REVIEW OF SYSTEMS: Cardiovascular: No chest pain, chest pressure, palpitations, orthopnea, or dyspnea on exertion. No claudication or rest pain,  No history of DVT or phlebitis. Pulmonary: No productive cough, asthma or wheezing. Neurologic: No weakness, paresthesias, aphasia, or amaurosis. No dizziness. Hematologic: No bleeding problems or clotting disorders. Musculoskeletal: No joint pain or joint swelling. Gastrointestinal: No blood in stool or hematemesis Genitourinary: No dysuria or hematuria.  Psychiatric:: No history of major depression. Integumentary: No rashes or ulcers. Constitutional: No fever or chills.  PHYSICAL EXAMINATION:   Vital signs are BP 126/63  Pulse 62  Resp 16  Ht 6\' 2"  (1.88 m)  Wt 222 lb (100.699 kg)  BMI 28.49 kg/m2 General: The patient appears their stated age. HEENT:  No gross abnormalities Pulmonary:  Non labored breathing  Musculoskeletal: There are no major deformities. Neurologic: No focal weakness or paresthesias are detected, Skin: There are no ulcer or rashes noted. Psychiatric: The patient has normal affect. Cardiovascular: There is a regular rate and rhythm without significant murmur appreciated. Palpable pedal pulses, with large  varicosities within the medial upper leg.   Diagnostic Studies Reflux evaluation was performed today. This shows no evidence of deep vein obstruction or reflux. There is residual reflux within the proximal great saphenous vein with diameter measurements from 0.55 to 1.1  Assessment: Symptomatic venous insufficiency, left leg Plan: I've recommended that the patient go back into his thigh high 20-30 mm compression stockings to see if this helps alleviate his symptoms. We also discussed leg elevation when possible. He will come back in 3 months for repeat evaluation by Dr. early. I believe he would be a good candidate for laser ablation of the proximal incompetent left great saphenous vein with stab phlebectomy of the varicosities in the left medial thigh  V. Durene Cal IV, M.D. [facility] [place] Office: [phone] Pager:  [phone]

## 2013-06-24 ENCOUNTER — Other Ambulatory Visit: Payer: Self-pay | Admitting: *Deleted

## 2013-06-24 DIAGNOSIS — M79609 Pain in unspecified limb: Secondary | ICD-10-CM

## 2013-06-24 DIAGNOSIS — I83893 Varicose veins of bilateral lower extremities with other complications: Secondary | ICD-10-CM

## 2013-09-10 ENCOUNTER — Other Ambulatory Visit: Payer: Self-pay

## 2013-09-22 ENCOUNTER — Ambulatory Visit: Payer: BC Managed Care – PPO | Admitting: Vascular Surgery

## 2013-09-28 ENCOUNTER — Encounter: Payer: Self-pay | Admitting: Vascular Surgery

## 2013-09-29 ENCOUNTER — Ambulatory Visit (INDEPENDENT_AMBULATORY_CARE_PROVIDER_SITE_OTHER): Payer: BC Managed Care – PPO | Admitting: Vascular Surgery

## 2013-09-29 ENCOUNTER — Encounter: Payer: Self-pay | Admitting: Vascular Surgery

## 2013-09-29 VITALS — BP 131/75 | HR 60 | Resp 18 | Ht 74.0 in | Wt 218.0 lb

## 2013-09-29 DIAGNOSIS — I83893 Varicose veins of bilateral lower extremities with other complications: Secondary | ICD-10-CM

## 2013-09-29 NOTE — Progress Notes (Signed)
Patient has today for continued discussion regarding painful or Costain his left leg. He had seen Dr. Myra Gianotti in August of this year. At that time he underwent formal venous duplex and this did show recanalization of the proximal segment of his left great saphenous vein with extensive varicosities arising off of this. He has been compliant with his graduated compression garments is that time and has had no improvement. He reports an achy sensation his leg and also reports significant pain specifically over the varicosities with prolonged standing. He is quite active in this does limit his daily activities. He has also elevated when possible does take ibuprofen for discomfort.  Past Medical History  Diagnosis Date  . Arthritis   . Varicose veins     History  Substance Use Topics  . Smoking status: Never Smoker   . Smokeless tobacco: Never Used  . Alcohol Use: 1.8 oz/week    3 Cans of beer per week    Family History  Problem Relation Age of Onset  . Diabetes Father   . Hypertension Mother     No Known Allergies  Current outpatient prescriptions:aspirin 81 MG tablet, Take 81 mg by mouth daily.  , Disp: , Rfl: ;  Cholecalciferol 1000 UNITS tablet, Take 1,000 Units by mouth daily.  , Disp: , Rfl: ;  clotrimazole-betamethasone (LOTRISONE) cream, Apply topically 2 (two) times daily. To flaky skin patches, Disp: 45 g, Rfl: 1;  doxycycline (VIBRA-TABS) 100 MG tablet, Take 1 tablet (100 mg total) by mouth 2 (two) times daily., Disp: 20 tablet, Rfl: 0 ibuprofen (ADVIL,MOTRIN) 600 MG tablet, Take 1 tablet (600 mg total) by mouth every 8 (eight) hours as needed for pain., Disp: 60 tablet, Rfl: 5;  scopolamine (TRANSDERM-SCOP) 1.5 MG, Place 1 patch (1.5 mg total) onto the skin every 3 (three) days., Disp: 10 patch, Rfl: 12  BP 131/75  Pulse 60  Resp 18  Ht 6\' 2"  (1.88 m)  Wt 218 lb (98.884 kg)  BMI 27.98 kg/m2  Body mass index is 27.98 kg/(m^2).       On physical exam well-developed  well-nourished gentleman in no acute distress To the source of pedis pulses bilaterally Does have a significant varicosities over the medial aspect of his left thigh. No venous ulceration. Neurologically is grossly intact  I did review his venous duplex from 06/22/2013 and reimaged these vessels with SonoSite ultrasound today. This does show recanalization of the proximal segment of his the saphenous vein up to the saphenofemoral junction with large varicosities arising from this. I have recommended that he proceed with laser ablation of his proximal great saphenous vein and stab phlebectomy of multiple curvature varicosities for symptom relief. We will proceed with this at his earliest convenience.

## 2013-10-06 ENCOUNTER — Other Ambulatory Visit: Payer: Self-pay | Admitting: *Deleted

## 2013-10-06 ENCOUNTER — Telehealth: Payer: Self-pay | Admitting: Vascular Surgery

## 2013-10-06 DIAGNOSIS — I83893 Varicose veins of bilateral lower extremities with other complications: Secondary | ICD-10-CM

## 2013-10-06 NOTE — Telephone Encounter (Signed)
Message copied by Fredrich Birks on Tue Oct 06, 2013  4:32 PM ------      Message from: Domenic Moras D      Created: Tue Oct 06, 2013  4:19 PM      Regarding: scheduling       Please schedule Wayne [name] for post laser ablation duplex (left leg, order in EPIC) and VV FU with [name] on 10-13-2013.  Thanks! ------

## 2013-10-07 ENCOUNTER — Encounter: Payer: Self-pay | Admitting: Vascular Surgery

## 2013-10-08 ENCOUNTER — Encounter: Payer: Self-pay | Admitting: Vascular Surgery

## 2013-10-08 ENCOUNTER — Ambulatory Visit (INDEPENDENT_AMBULATORY_CARE_PROVIDER_SITE_OTHER): Payer: BC Managed Care – PPO | Admitting: Vascular Surgery

## 2013-10-08 VITALS — BP 143/79 | HR 58 | Resp 18 | Ht 74.0 in | Wt 216.0 lb

## 2013-10-08 DIAGNOSIS — I83893 Varicose veins of bilateral lower extremities with other complications: Secondary | ICD-10-CM

## 2013-10-08 HISTORY — PX: ENDOVENOUS ABLATION SAPHENOUS VEIN W/ LASER: SUR449

## 2013-10-08 NOTE — Progress Notes (Signed)
   Laser Ablation Procedure      Date: 10/08/2013    Wayne [name] DOB:1956-05-27  Consent signed: Yes  Surgeon:T.F. Ansen Sayegh  Procedure: Laser Ablation: left Greater Saphenous Vein   BP 143/79  Pulse 58  Resp 18  Ht 6\' 2"  (1.88 m)  Wt 216 lb (97.977 kg)  BMI 27.72 kg/m2  Start time: 10:45 AM   End time: 11:55 AM  Tumescent Anesthesia: 400 cc 0.9% NaCl with 50 cc Lidocaine HCL with 1% Epi and 15 cc 8.4% NaHCO3  Local Anesthesia: 3 cc Lidocaine HCL and NaHCO3 (ratio 2:1)  Continuous Mode: 15 Watts Total Energy 679.4 Joules Total Time0.51     Stab Phlebectomy: 10-20 incisions Sites: Thigh  Left leg  Patient tolerated procedure well: Yes    Description of Procedure:  After marking the course of the saphenous vein and the secondary varicosities in the standing position, the patient was placed on the operating table in the supine position, and the left leg was prepped and draped in sterile fashion. Local anesthetic was administered, and under ultrasound guidance the saphenous vein was accessed with a micro needle and guide wire; then the micro puncture sheath was placed. A guide wire was inserted to the saphenofemoral junction, followed by a 5 french sheath.  The position of the sheath and then the laser fiber below the junction was confirmed using the ultrasound and visualization of the aiming beam.  Tumescent anesthesia was administered along the course of the saphenous vein using ultrasound guidance. Protective laser glasses were placed on the patient, and the laser was fired at at 15 watt continuous mode.  For a total of 679.4 joules.  A steri strip was applied to the puncture site.  The patient was then put into Trendelenburg position.  Local anesthetic was utilized overlying the marked varicosities.   10-20 stab wounds were made using the tip of an 11 blade; and using the vein hook,  The phlebectomies were performed using a hemostat to avulse these varicosities.  Adequate  hemostasis was achieved, and steri strips were applied to the stab wound.      ABD pads and thigh high compression stockings were applied.  Ace wrap bandages were applied over the phlebectomy sites and at the top of the saphenofemoral junction.  Blood loss was less than 15 cc.  The patient ambulated out of the operating room having tolerated the procedure well.

## 2013-10-12 ENCOUNTER — Encounter: Payer: Self-pay | Admitting: Vascular Surgery

## 2013-10-13 ENCOUNTER — Encounter: Payer: Self-pay | Admitting: Vascular Surgery

## 2013-10-13 ENCOUNTER — Ambulatory Visit (HOSPITAL_COMMUNITY)
Admission: RE | Admit: 2013-10-13 | Discharge: 2013-10-13 | Disposition: A | Discharge status: Home / Self Care | Payer: BC Managed Care – PPO | Source: Ambulatory Visit | Attending: Vascular Surgery | Admitting: Vascular Surgery

## 2013-10-13 ENCOUNTER — Telehealth: Payer: Self-pay | Admitting: *Deleted

## 2013-10-13 ENCOUNTER — Ambulatory Visit (INDEPENDENT_AMBULATORY_CARE_PROVIDER_SITE_OTHER): Payer: Self-pay | Admitting: Vascular Surgery

## 2013-10-13 VITALS — BP 135/75 | HR 62 | Resp 18 | Ht 74.0 in | Wt 216.0 lb

## 2013-10-13 DIAGNOSIS — I83893 Varicose veins of bilateral lower extremities with other complications: Secondary | ICD-10-CM | POA: Insufficient documentation

## 2013-10-13 NOTE — Telephone Encounter (Signed)
    10/13/2013  Time: 8:58 AM   Patient Name: Wayne [name]  Patient of: T.F. Early  Procedure:Laser Ablation left proximal greater saphenous vein and stab phlebectomy 10-20 incisions left leg 10-08-2013  Reached patient at home and checked  His status  Yes    Comments/Actions Taken: [name] states no problems with bleeding/oozing or pain.  Reviewed post procedural instructions with [name] and reminded him of post laser ablation duplex and follow up with [name] on 10-13-2013.      @SIGNATURE @

## 2013-10-13 NOTE — Progress Notes (Signed)
Here today for one week followup of laser ablation of saphenous vein in his left thigh and stab phlebectomy 10-20 tributary varicosities. He is an extremely well with the procedure. He reports mild discomfort. He has been compliant with his compression garments.  Physical exam he has mild bruising in the phlebectomy sites and no evidence of erythema at the ablation site  Duplex today reveals closure of the saphenous vein and the saphenofemoral junction distally. No evidence of DVT  Impression and plan successful ablation of proximal left great saphenous vein and stab phlebectomy. He will resume full activities and see [place] again on an as-needed basis

## 2014-06-17 ENCOUNTER — Telehealth: Payer: Self-pay | Admitting: *Deleted

## 2014-06-17 DIAGNOSIS — Z125 Encounter for screening for malignant neoplasm of prostate: Secondary | ICD-10-CM

## 2014-06-17 DIAGNOSIS — Z0000 Encounter for general adult medical examination without abnormal findings: Secondary | ICD-10-CM

## 2014-06-17 NOTE — Telephone Encounter (Signed)
Left msg on triage stating cpx is 8/28. Need to have labs done prior. called pt no answer LMOM entered standard cpx labs...Johny Chess

## 2014-06-21 ENCOUNTER — Other Ambulatory Visit (INDEPENDENT_AMBULATORY_CARE_PROVIDER_SITE_OTHER): Payer: BC Managed Care – PPO

## 2014-06-21 DIAGNOSIS — Z0000 Encounter for general adult medical examination without abnormal findings: Secondary | ICD-10-CM

## 2014-06-21 DIAGNOSIS — Z125 Encounter for screening for malignant neoplasm of prostate: Secondary | ICD-10-CM

## 2014-06-21 LAB — URINALYSIS, ROUTINE W REFLEX MICROSCOPIC
Bilirubin Urine: NEGATIVE
Hgb urine dipstick: NEGATIVE
Ketones, ur: NEGATIVE
Leukocytes, UA: NEGATIVE
NITRITE: NEGATIVE
PH: 6 (ref 5.0–8.0)
RBC / HPF: NONE SEEN (ref 0–?)
SPECIFIC GRAVITY, URINE: 1.015 (ref 1.000–1.030)
TOTAL PROTEIN, URINE-UPE24: NEGATIVE
Urine Glucose: NEGATIVE
Urobilinogen, UA: 0.2 (ref 0.0–1.0)
WBC UA: NONE SEEN (ref 0–?)

## 2014-06-21 LAB — BASIC METABOLIC PANEL
BUN: 16 mg/dL (ref 6–23)
CO2: 26 meq/L (ref 19–32)
Calcium: 9.6 mg/dL (ref 8.4–10.5)
Chloride: 105 mEq/L (ref 96–112)
Creatinine, Ser: 0.8 mg/dL (ref 0.4–1.5)
GFR: 101.7 mL/min (ref >60.00)
GLUCOSE: 78 mg/dL (ref 70–99)
POTASSIUM: 3.9 meq/L (ref 3.5–5.1)
SODIUM: 141 meq/L (ref 135–145)

## 2014-06-21 LAB — LIPID PANEL
CHOLESTEROL: 215 mg/dL — AB (ref 0–200)
HDL: 44.5 mg/dL (ref >39.00)
LDL CALC: 154 mg/dL — AB (ref 0–99)
NonHDL: 170.5
Total CHOL/HDL Ratio: 5
Triglycerides: 85 mg/dL (ref 0.0–149.0)
VLDL: 17 mg/dL (ref 0.0–40.0)

## 2014-06-21 LAB — CBC WITH DIFFERENTIAL/PLATELET
BASOS PCT: 0.7 % (ref 0.0–3.0)
Basophils Absolute: 0 10*3/uL (ref 0.0–0.1)
EOS PCT: 2.5 % (ref 0.0–5.0)
Eosinophils Absolute: 0.1 10*3/uL (ref 0.0–0.7)
HCT: 40.7 % (ref 39.0–52.0)
Hemoglobin: 13.5 g/dL (ref 13.0–17.0)
LYMPHS PCT: 23.9 % (ref 12.0–46.0)
Lymphs Abs: 1.4 10*3/uL (ref 0.7–4.0)
MCHC: 33.2 g/dL (ref 30.0–36.0)
MCV: 90.4 fl (ref 78.0–100.0)
MONO ABS: 0.4 10*3/uL (ref 0.1–1.0)
MONOS PCT: 7.2 % (ref 3.0–12.0)
NEUTROS PCT: 65.7 % (ref 43.0–77.0)
Neutro Abs: 3.9 10*3/uL (ref 1.4–7.7)
Platelets: 244 10*3/uL (ref 150.0–400.0)
RBC: 4.5 Mil/uL (ref 4.22–5.81)
RDW: 12.9 % (ref 11.5–15.5)
WBC: 6 10*3/uL (ref 4.0–10.5)

## 2014-06-21 LAB — HEPATIC FUNCTION PANEL
ALBUMIN: 4.3 g/dL (ref 3.5–5.2)
ALT: 20 U/L (ref 0–53)
AST: 22 U/L (ref 0–37)
Alkaline Phosphatase: 57 U/L (ref 39–117)
BILIRUBIN TOTAL: 1 mg/dL (ref 0.2–1.2)
Bilirubin, Direct: 0.1 mg/dL (ref 0.0–0.3)
Total Protein: 6.5 g/dL (ref 6.0–8.3)

## 2014-06-21 LAB — TSH: TSH: 1.62 u[IU]/mL (ref 0.35–4.50)

## 2014-06-21 LAB — PSA: PSA: 0.32 ng/mL (ref 0.10–4.00)

## 2014-06-29 ENCOUNTER — Encounter: Payer: Self-pay | Admitting: Internal Medicine

## 2014-06-29 ENCOUNTER — Ambulatory Visit (INDEPENDENT_AMBULATORY_CARE_PROVIDER_SITE_OTHER): Payer: BC Managed Care – PPO | Admitting: Internal Medicine

## 2014-06-29 VITALS — BP 110/72 | HR 60 | Temp 97.8°F | Resp 16 | Ht 74.0 in | Wt 218.0 lb

## 2014-06-29 DIAGNOSIS — Z0000 Encounter for general adult medical examination without abnormal findings: Secondary | ICD-10-CM

## 2014-06-29 DIAGNOSIS — E785 Hyperlipidemia, unspecified: Secondary | ICD-10-CM

## 2014-06-29 DIAGNOSIS — Z1211 Encounter for screening for malignant neoplasm of colon: Secondary | ICD-10-CM

## 2014-06-29 NOTE — Assessment & Plan Note (Signed)
Try MegaRed Krill oil

## 2014-06-29 NOTE — Patient Instructions (Signed)
Try MegaRed Krill oil

## 2014-06-29 NOTE — Progress Notes (Signed)
Pre visit review using our clinic review tool, if applicable. No additional management support is needed unless otherwise documented below in the visit note. 

## 2014-06-29 NOTE — Assessment & Plan Note (Signed)
We discussed age appropriate health related issues, including available/recomended screening tests and vaccinations. We discussed a need for adhering to healthy diet and exercise. Labs/EKG were reviewed/ordered. All questions were answered. Form filled out.

## 2014-06-29 NOTE — Progress Notes (Signed)
   Subjective:    HPI  The patient is here for a wellness exam. The patient has been doing well overall without major physical or psychological issues going on lately.  BP Readings from Last 3 Encounters:  06/29/14 110/72  10/13/13 135/75  10/08/13 143/79   Wt Readings from Last 3 Encounters:  06/29/14 218 lb (98.884 kg)  10/13/13 216 lb (97.977 kg)  10/08/13 216 lb (97.977 kg)       Review of Systems  Constitutional: Positive for unexpected weight change. Negative for appetite change and fatigue.  HENT: Negative for congestion, nosebleeds, sneezing and sore throat.   Eyes: Negative for itching and visual disturbance.  Respiratory: Negative for cough.   Cardiovascular: Negative for palpitations and leg swelling.  Gastrointestinal: Negative for nausea, diarrhea, blood in stool and abdominal distention.  Genitourinary: Negative for frequency and hematuria.  Musculoskeletal: Negative for back pain, gait problem and joint swelling.  Skin: Negative for rash.  Neurological: Negative for dizziness, tremors and speech difficulty.  Psychiatric/Behavioral: Negative for sleep disturbance, dysphoric mood and agitation. The patient is not nervous/anxious.        Objective:   Physical Exam  Constitutional: He is oriented to person, place, and time. He appears well-developed and well-nourished. No distress.  HENT:  Head: Normocephalic and atraumatic.  Right Ear: External ear normal.  Left Ear: External ear normal.  Nose: Nose normal.  Mouth/Throat: Oropharynx is clear and moist. No oropharyngeal exudate.  Eyes: Conjunctivae and EOM are normal. Pupils are equal, round, and reactive to light. Right eye exhibits no discharge. Left eye exhibits no discharge. No scleral icterus.  Neck: Normal range of motion. Neck supple. No JVD present. No tracheal deviation present. No thyromegaly present.  Cardiovascular: Normal rate, regular rhythm, normal heart sounds and intact distal pulses.  Exam  reveals no gallop and no friction rub.   No murmur heard. Pulmonary/Chest: Effort normal and breath sounds normal. No stridor. No respiratory distress. He has no wheezes. He has no rales. He exhibits no tenderness.  Abdominal: Soft. Bowel sounds are normal. He exhibits no distension and no mass. There is no tenderness. There is no rebound and no guarding.  Genitourinary: Rectum normal, prostate normal and penis normal. Guaiac negative stool. No penile tenderness.  Musculoskeletal: Normal range of motion. He exhibits no edema and no tenderness.  Lymphadenopathy:    He has no cervical adenopathy.  Neurological: He is alert and oriented to person, place, and time. He has normal reflexes. No cranial nerve deficit. He exhibits normal muscle tone. Coordination normal.  Skin: Skin is warm and dry. No rash noted. He is not diaphoretic. No erythema. No pallor.  Psychiatric: He has a normal mood and affect. His behavior is normal. Judgment and thought content normal.      Lab Results  Component Value Date   WBC 6.0 06/21/2014   HGB 13.5 06/21/2014   HCT 40.7 06/21/2014   PLT 244.0 06/21/2014   CHOL 215* 06/21/2014   TRIG 85.0 06/21/2014   HDL 44.50 06/21/2014   LDLDIRECT 155.3 05/05/2013   ALT 20 06/21/2014   AST 22 06/21/2014   NA 141 06/21/2014   K 3.9 06/21/2014   CL 105 06/21/2014   CREATININE 0.8 06/21/2014   BUN 16 06/21/2014   CO2 26 06/21/2014   TSH 1.62 06/21/2014   PSA 0.32 06/21/2014       Assessment & Plan:

## 2014-08-03 ENCOUNTER — Encounter: Payer: Self-pay | Admitting: Internal Medicine

## 2014-08-05 ENCOUNTER — Encounter: Payer: Self-pay | Admitting: Gastroenterology

## 2014-09-21 ENCOUNTER — Ambulatory Visit (AMBULATORY_SURGERY_CENTER): Payer: Self-pay | Admitting: *Deleted

## 2014-09-21 VITALS — Ht 74.0 in | Wt 217.2 lb

## 2014-09-21 DIAGNOSIS — Z1211 Encounter for screening for malignant neoplasm of colon: Secondary | ICD-10-CM

## 2014-09-21 MED ORDER — MOVIPREP 100 G PO SOLR: ORAL | Status: DC

## 2014-09-21 NOTE — Progress Notes (Signed)
No egg or soy allergy  No anesthesia or intubation problems per pt  No diet medications taken  Registered in EMMI   

## 2014-09-28 ENCOUNTER — Encounter: Payer: Self-pay | Admitting: Gastroenterology

## 2014-10-12 ENCOUNTER — Encounter: Payer: Self-pay | Admitting: Gastroenterology

## 2014-10-12 ENCOUNTER — Ambulatory Visit (AMBULATORY_SURGERY_CENTER): Payer: BC Managed Care – PPO | Admitting: Gastroenterology

## 2014-10-12 VITALS — BP 107/67 | HR 60 | Temp 97.0°F | Resp 17 | Ht 74.0 in | Wt 217.0 lb

## 2014-10-12 DIAGNOSIS — K5730 Diverticulosis of large intestine without perforation or abscess without bleeding: Secondary | ICD-10-CM

## 2014-10-12 DIAGNOSIS — Z1211 Encounter for screening for malignant neoplasm of colon: Secondary | ICD-10-CM

## 2014-10-12 MED ORDER — SODIUM CHLORIDE 0.9 % IV SOLN: 500.0000 mL | INTRAVENOUS | Status: DC

## 2014-10-12 NOTE — Patient Instructions (Signed)
Discharge instructions given. Handouts on diverticulosis and a high fiber diet. Resume previous medications. YOU HAD AN ENDOSCOPIC PROCEDURE TODAY AT THE [facility]: Refer to the procedure report that was given to you for any specific questions about what was found during the examination.  If the procedure report does not answer your questions, please call your gastroenterologist to clarify.  If you requested that your care partner not be given the details of your procedure findings, then the procedure report has been included in a sealed envelope for you to review at your convenience later.  YOU SHOULD EXPECT: Some feelings of bloating in the abdomen. Passage of more gas than usual.  Walking can help get rid of the air that was put into your GI tract during the procedure and reduce the bloating. If you had a lower endoscopy (such as a colonoscopy or flexible sigmoidoscopy) you may notice spotting of blood in your stool or on the toilet paper. If you underwent a bowel prep for your procedure, then you may not have a normal bowel movement for a few days.  DIET: Your first meal following the procedure should be a light meal and then it is ok to progress to your normal diet.  A half-sandwich or bowl of soup is an example of a good first meal.  Heavy or fried foods are harder to digest and may make you feel nauseous or bloated.  Likewise meals heavy in dairy and vegetables can cause extra gas to form and this can also increase the bloating.  Drink plenty of fluids but you should avoid alcoholic beverages for 24 hours.  ACTIVITY: Your care partner should take you home directly after the procedure.  You should plan to take it easy, moving slowly for the rest of the day.  You can resume normal activity the day after the procedure however you should NOT DRIVE or use heavy machinery for 24 hours (because of the sedation medicines used during the test).    SYMPTOMS TO REPORT IMMEDIATELY: A  gastroenterologist can be reached at any hour.  During normal business hours, 8:30 AM to 5:00 PM Monday through Friday, call [phone].  After hours and on weekends, please call the GI answering service at [phone] who will take a message and have the physician on call contact you.   Following lower endoscopy (colonoscopy or flexible sigmoidoscopy):  Excessive amounts of blood in the stool  Significant tenderness or worsening of abdominal pains  Swelling of the abdomen that is new, acute  Fever of 100F or higher  FOLLOW UP: If any biopsies were taken you will be contacted by phone or by letter within the next 1-3 weeks.  Call your gastroenterologist if you have not heard about the biopsies in 3 weeks.  Our staff will call the home number listed on your records the next business day following your procedure to check on you and address any questions or concerns that you may have at that time regarding the information given to you following your procedure. This is a courtesy call and so if there is no answer at the home number and we have not heard from you through the emergency physician on call, we will assume that you have returned to your regular daily activities without incident.  SIGNATURES/CONFIDENTIALITY: You and/or your care partner have signed paperwork which will be entered into your electronic medical record.  These signatures attest to the fact that that the information above on your After Visit Summary   has been reviewed and is understood.  Full responsibility of the confidentiality of this discharge information lies with you and/or your care-partner. 

## 2014-10-12 NOTE — Progress Notes (Signed)
Procedure ends, to recovery, report given and VSS. 

## 2014-10-13 ENCOUNTER — Telehealth: Payer: Self-pay

## 2014-10-13 NOTE — Telephone Encounter (Signed)
Left a message at [phone] for the pt to call us back if any questions or concerns. maw

## 2014-10-13 NOTE — Op Note (Signed)
[facility]  [address] [place], [address]   COLONOSCOPY PROCEDURE REPORT  PATIENT: Wayne [name], Wayne [name]  MR#: [id number] BIRTHDATE: 29-Mar-1956 , 17  yrs. old GENDER: male ENDOSCOPIST: Milus Banister, MD REFERRED RR:NHAF Avel Sensor, M.D. PROCEDURE DATE:  10/12/2014 PROCEDURE:   Colonoscopy, diagnostic First Screening Colonoscopy - Avg.  risk and is 50 yrs.  old or older Yes.  Prior Negative Screening - Now for repeat screening. N/A  History of Adenoma - Now for follow-up colonoscopy & has been > or = to 3 yrs.  N/A  Polyps Removed Today? No.  Recommend repeat exam, <10 yrs? No. ASA CLASS:   Class II INDICATIONS:average risk for colon cancer. MEDICATIONS: Monitored anesthesia care and Propofol 200 mg IV  DESCRIPTION OF PROCEDURE:   After the risks benefits and alternatives of the procedure were thoroughly explained, informed consent was obtained.  The digital rectal exam revealed no abnormalities of the rectum.   The LB CF-H180AL Loaner [id number] endoscope was introduced through the anus and advanced to the cecum, which was identified by both the appendix and ileocecal valve. No adverse events experienced.   The quality of the prep was excellent.  The instrument was then slowly withdrawn as the colon was fully examined.   COLON FINDINGS: There were diverticular changes throughout the colon, most significant on left side.  The examination was otherwise normal.  Retroflexed views revealed no abnormalities. The time to cecum=2 minutes 36 seconds.  Withdrawal time=6 minutes 55 seconds.  The scope was withdrawn and the procedure completed. COMPLICATIONS: There were no immediate complications.  ENDOSCOPIC IMPRESSION: There were diverticular changes throughout the colon, most significant on left side. The examination was otherwise normal.  No polyps or cancers.  RECOMMENDATIONS: You should continue to follow colorectal cancer screening guidelines for "routine  risk" patients with a repeat colonoscopy in 10 years.  eSigned:  Milus Banister, MD 10/12/2014 8:57 AM

## 2014-12-07 ENCOUNTER — Ambulatory Visit: Payer: Self-pay | Admitting: Internal Medicine

## 2015-08-03 ENCOUNTER — Ambulatory Visit: Payer: Self-pay | Admitting: Family Medicine

## 2017-06-26 ENCOUNTER — Encounter: Payer: Self-pay | Admitting: Internal Medicine

## 2017-06-26 ENCOUNTER — Ambulatory Visit (INDEPENDENT_AMBULATORY_CARE_PROVIDER_SITE_OTHER): Payer: 59 | Admitting: Internal Medicine

## 2017-06-26 ENCOUNTER — Other Ambulatory Visit (INDEPENDENT_AMBULATORY_CARE_PROVIDER_SITE_OTHER): Payer: 59

## 2017-06-26 VITALS — BP 110/70 | HR 56 | Temp 97.7°F | Ht 74.0 in | Wt 222.0 lb

## 2017-06-26 DIAGNOSIS — Z0000 Encounter for general adult medical examination without abnormal findings: Secondary | ICD-10-CM

## 2017-06-26 DIAGNOSIS — N402 Nodular prostate without lower urinary tract symptoms: Secondary | ICD-10-CM | POA: Insufficient documentation

## 2017-06-26 DIAGNOSIS — M25522 Pain in left elbow: Secondary | ICD-10-CM | POA: Insufficient documentation

## 2017-06-26 LAB — URINALYSIS
BILIRUBIN URINE: NEGATIVE
Hgb urine dipstick: NEGATIVE
KETONES UR: NEGATIVE
Leukocytes, UA: NEGATIVE
Nitrite: NEGATIVE
SPECIFIC GRAVITY, URINE: 1.01 (ref 1.000–1.030)
Total Protein, Urine: NEGATIVE
UROBILINOGEN UA: 0.2 (ref 0.0–1.0)
Urine Glucose: NEGATIVE
pH: 7 (ref 5.0–8.0)

## 2017-06-26 LAB — CBC WITH DIFFERENTIAL/PLATELET
Basophils Absolute: 0 10*3/uL (ref 0.0–0.1)
Basophils Relative: 0.8 % (ref 0.0–3.0)
Eosinophils Absolute: 0.2 10*3/uL (ref 0.0–0.7)
Eosinophils Relative: 3.5 % (ref 0.0–5.0)
HEMATOCRIT: 41.1 % (ref 39.0–52.0)
HEMOGLOBIN: 13.9 g/dL (ref 13.0–17.0)
LYMPHS PCT: 24.5 % (ref 12.0–46.0)
Lymphs Abs: 1.3 10*3/uL (ref 0.7–4.0)
MCHC: 33.7 g/dL (ref 30.0–36.0)
MCV: 91.1 fl (ref 78.0–100.0)
MONO ABS: 0.6 10*3/uL (ref 0.1–1.0)
MONOS PCT: 10.4 % (ref 3.0–12.0)
Neutro Abs: 3.3 10*3/uL (ref 1.4–7.7)
Neutrophils Relative %: 60.8 % (ref 43.0–77.0)
Platelets: 242 10*3/uL (ref 150.0–400.0)
RBC: 4.51 Mil/uL (ref 4.22–5.81)
RDW: 13.4 % (ref 11.5–15.5)
WBC: 5.5 10*3/uL (ref 4.0–10.5)

## 2017-06-26 LAB — HEPATIC FUNCTION PANEL
ALBUMIN: 4.5 g/dL (ref 3.5–5.2)
ALK PHOS: 55 U/L (ref 39–117)
ALT: 19 U/L (ref 0–53)
AST: 21 U/L (ref 0–37)
Bilirubin, Direct: 0.1 mg/dL (ref 0.0–0.3)
Total Bilirubin: 0.6 mg/dL (ref 0.2–1.2)
Total Protein: 6.9 g/dL (ref 6.0–8.3)

## 2017-06-26 LAB — LIPID PANEL
CHOLESTEROL: 209 mg/dL — AB (ref 0–200)
HDL: 47.9 mg/dL (ref >39.00)
LDL Cholesterol: 144 mg/dL — ABNORMAL HIGH (ref 0–99)
NonHDL: 160.68
Total CHOL/HDL Ratio: 4
Triglycerides: 85 mg/dL (ref 0.0–149.0)
VLDL: 17 mg/dL (ref 0.0–40.0)

## 2017-06-26 LAB — BASIC METABOLIC PANEL
BUN: 14 mg/dL (ref 6–23)
CHLORIDE: 104 meq/L (ref 96–112)
CO2: 28 mEq/L (ref 19–32)
Calcium: 9.8 mg/dL (ref 8.4–10.5)
Creatinine, Ser: 0.82 mg/dL (ref 0.40–1.50)
GFR: 102.05 mL/min (ref >60.00)
Glucose, Bld: 99 mg/dL (ref 70–99)
Potassium: 4.4 mEq/L (ref 3.5–5.1)
SODIUM: 141 meq/L (ref 135–145)

## 2017-06-26 LAB — PSA: PSA: 0.4 ng/mL (ref 0.10–4.00)

## 2017-06-26 LAB — TSH: TSH: 1.62 u[IU]/mL (ref 0.35–4.50)

## 2017-06-26 NOTE — Assessment & Plan Note (Signed)
L lobe PSA Urol ref

## 2017-06-26 NOTE — Progress Notes (Signed)
Subjective:  Patient ID: Wayne [name], male    DOB: 1956-04-21  Age: 61 y.o. MRN: [id number]  CC: No chief complaint on file.   HPI BART ASHFORD presents for a well exam C/o L elbow pain - worse w/playing tennis - back hand  Outpatient Medications Prior to Visit  Medication Sig Dispense Refill  . clotrimazole-betamethasone (LOTRISONE) cream Apply topically 2 (two) times daily. To flaky skin patches 45 g 1  . ibuprofen (ADVIL,MOTRIN) 600 MG tablet Take 1 tablet (600 mg total) by mouth every 8 (eight) hours as needed for pain. 60 tablet 5   No facility-administered medications prior to visit.     ROS Review of Systems  Constitutional: Negative for appetite change, fatigue and unexpected weight change.  HENT: Negative for congestion, nosebleeds, sneezing, sore throat and trouble swallowing.   Eyes: Negative for itching and visual disturbance.  Respiratory: Negative for cough.   Cardiovascular: Negative for chest pain, palpitations and leg swelling.  Gastrointestinal: Negative for abdominal distention, blood in stool, diarrhea and nausea.  Genitourinary: Negative for frequency and hematuria.  Musculoskeletal: Negative for back pain, gait problem, joint swelling and neck pain.  Skin: Negative for rash.  Neurological: Negative for dizziness, tremors, speech difficulty and weakness.  Psychiatric/Behavioral: Negative for agitation, dysphoric mood and sleep disturbance. The patient is not nervous/anxious.     Objective:  BP 110/70 (BP Location: Left Arm, Patient Position: Sitting, Cuff Size: Large)   Pulse (!) 56   Temp 97.7 F (36.5 C) (Oral)   Ht 6\' 2"  (1.88 m)   Wt 222 lb (100.7 kg)   SpO2 99%   BMI 28.50 kg/m   BP Readings from Last 3 Encounters:  06/26/17 110/70  10/12/14 107/67  06/29/14 110/72    Wt Readings from Last 3 Encounters:  06/26/17 222 lb (100.7 kg)  10/12/14 217 lb (98.4 kg)  09/21/14 217 lb 3.2 oz (98.5 kg)    Physical Exam  Constitutional: He  is oriented to person, place, and time. He appears well-developed. No distress.  NAD  HENT:  Mouth/Throat: Oropharynx is clear and moist.  Eyes: Pupils are equal, round, and reactive to light. Conjunctivae are normal.  Neck: Normal range of motion. No JVD present. No thyromegaly present.  Cardiovascular: Normal rate, regular rhythm, normal heart sounds and intact distal pulses.  Exam reveals no gallop and no friction rub.   No murmur heard. Pulmonary/Chest: Effort normal and breath sounds normal. No respiratory distress. He has no wheezes. He has no rales. He exhibits no tenderness.  Abdominal: Soft. Bowel sounds are normal. He exhibits no distension and no mass. There is no tenderness. There is no rebound and no guarding.  Genitourinary: Rectum normal. Rectal exam shows guaiac negative stool.  Musculoskeletal: Normal range of motion. He exhibits tenderness. He exhibits no edema.  Lymphadenopathy:    He has no cervical adenopathy.  Neurological: He is alert and oriented to person, place, and time. He has normal reflexes. No cranial nerve deficit. He exhibits normal muscle tone. He displays a negative Romberg sign. Coordination and gait normal.  Skin: Skin is warm and dry. No rash noted.  Psychiatric: He has a normal mood and affect. His behavior is normal. Judgment and thought content normal.  Prostate 1+ w/L nodule L prox biceps, L lat and med epicondyl - tender  Lab Results  Component Value Date   WBC 6.0 06/21/2014   HGB 13.5 06/21/2014   HCT 40.7 06/21/2014   PLT 244.0 06/21/2014  GLUCOSE 78 06/21/2014   CHOL 215 (H) 06/21/2014   TRIG 85.0 06/21/2014   HDL 44.50 06/21/2014   LDLDIRECT 155.3 05/05/2013   LDLCALC 154 (H) 06/21/2014   ALT 20 06/21/2014   AST 22 06/21/2014   NA 141 06/21/2014   K 3.9 06/21/2014   CL 105 06/21/2014   CREATININE 0.8 06/21/2014   BUN 16 06/21/2014   CO2 26 06/21/2014   TSH 1.62 06/21/2014   PSA 0.32 06/21/2014    No results  found.  Assessment & Plan:   There are no diagnoses linked to this encounter. I am having Mr. Lotter maintain his clotrimazole-betamethasone and ibuprofen.  No orders of the defined types were placed in this encounter.    Follow-up: No Follow-up on file.  Walker Kehr, MD

## 2017-06-26 NOTE — Assessment & Plan Note (Addendum)
MSK Elastic elbow sleeve ice

## 2017-06-26 NOTE — Assessment & Plan Note (Addendum)
We discussed age appropriate health related issues, including available/recomended screening tests and vaccinations. We discussed a need for adhering to healthy diet and exercise. Labs were ordered to be later reviewed . All questions were answered.  Colon due in 2025 Declined tDAP, flu shot Shingrix

## 2017-07-22 NOTE — Progress Notes (Signed)
[facility] [address] [place], [place] [address] Phone: [phone] Subjective:    I'm seeing this patient by the request  of:  Plotnikov, Evie Lacks, MD   CC: Elbow pain, left  LFY:[id number]  Wayne [name] is a 61 y.o. male coming in with complaint of left elbow pain. Patient says its been hurting on and off for a couple of months. He has numbness and tingling that starts midway down his arms and stops at the top of his hand.   Location- lateral posterior elbow Duration- Coated in 3 months and has the pain daily Character-Aching sensation Aggravating factors- Reliving factors- patient states if he does not use it as much and seems to do better. Therapies tried- none Severity-  6    past imaging including patient having a cervical neck x-rays taken in 2013. Found to have degenerative disc disease at multiple levels from C4-C7. This was independently visualized by me  Past Medical History:  Diagnosis Date  . Arthritis   . Hyperlipidemia    slightly elevated, no meds  . Varicose veins    Past Surgical History:  Procedure Laterality Date  . ENDOVENOUS ABLATION SAPHENOUS VEIN W/ LASER Left 08-10-2009   left greater saphenous vein  . ENDOVENOUS ABLATION SAPHENOUS VEIN W/ LASER Left 10-08-2013   EVLA left proximal greater saphenous vein and stab phlebectomy 10-20 incisions left leg by Curt Jews MD  . VASECTOMY     Social History   Social History  . Marital status: Married    Spouse name: N/A  . Number of children: 2  . Years of education: N/A   Social History Main Topics  . Smoking status: Never Smoker  . Smokeless tobacco: Never Used  . Alcohol use 1.8 oz/week    3 Cans of beer per week  . Drug use: No  . Sexual activity: Yes   Other Topics Concern  . Not on file   Social History Narrative   Regular Exercise- no   No Known Allergies Family History  Problem Relation Age of Onset  . Diabetes Father   . Hypertension Mother     . Colon cancer Neg Hx   . Esophageal cancer Neg Hx   . Rectal cancer Neg Hx   . Stomach cancer Neg Hx      Past medical history, social, surgical and family history all reviewed in electronic medical record.  No pertanent information unless stated regarding to the chief complaint.   Review of Systems:Review of systems updated and as accurate as of 07/22/17  No headache, visual changes, nausea, vomiting, diarrhea, constipation, dizziness, abdominal pain, skin rash, fevers, chills, night sweats, weight loss, swollen lymph nodes, body aches, joint swelling, muscle aches, chest pain, shortness of breath, mood changes.   Objective  There were no vitals taken for this visit. Systems examined below as of 07/22/17   General: No apparent distress alert and oriented x3 mood and affect normal, dressed appropriately.  HEENT: Pupils equal, extraocular movements intact  Respiratory: Patient's speak in full sentences and does not appear short of breath  Cardiovascular: No lower extremity edema, non tender, no erythema  Skin: Warm dry intact with no signs of infection or rash on extremities or on axial skeleton.  Abdomen: Soft nontender  Neuro: Cranial nerves II through XII are intact, neurovascularly intact in all extremities with 2+ DTRs and 2+ pulses.  Lymph: No lymphadenopathy of posterior or anterior cervical chain or axillae bilaterally.  Gait normal  with good balance and coordination.  MSK:  Non tender with full range of motion and good stability and symmetric strength and tone of shoulders,  wrist, hip, knee and ankles bilaterally.  Elbow: Left Unremarkable to inspection. Range of motion full pronation, supination, flexion, extension. Strength is full to all of the above directions Stable to varus, valgus stress. Negative moving valgus stress test. Severe tenderness over the lateral epicondylar region and does have pain with resisted extension of the wrist in the same vicinity Ulnar nerve  does not sublux. Negative cubital tunnel Tinel's.  Musculoskeletal ultrasound was performed and interpreted by Charlann Boxer D.O.   Elbow:  Lateral epicondyle and common extensor tendon origin visualized. Patient does have a large intersubstance tear at the origin of the lateral epicondylar region and the extensor common tendon. Patient also has an avulsion fracture that his minorly healed at the time but still has a soft callus formation noted. Minimal neovascularization noted.   IMPRESSION:  Lateral avulsion fracture with a extensor common tendon tear  Procedure  97110; 15 additional minutes spent for Therapeutic exercises as stated in above notes.  This included exercises focusing on stretching, strengthening, with significant focus on eccentric aspects.   Long term goals include an improvement in range of motion, strength, endurance as well as avoiding reinjury. Patient's frequency would include in 1-2 times a day, 3-5 times a week for a duration of 6-12 weeks. Lateral Epicondylitis: Elbow anatomy was reviewed, and tendinopathy was explained.  Pt. given a formal rehab program. Series of concentric and eccentric exercises should be done starting with no weight, work up to 1 lb, hammer, etc.  Use counterforce strap if working or using hands.  Formal PT would be beneficial. Emphasized stretching an cross-friction massage Emphasized proper palms up lifting biomechanics to unload ECRB   Proper technique shown and discussed handout in great detail with ATC.  All questions were discussed and answered.   Impression and Recommendations:     This case required medical decision making of moderate complexity.      Note: This dictation was prepared with Dragon dictation along with smaller phrase technology. Any transcriptional errors that result from this process are unintentional.

## 2017-07-23 ENCOUNTER — Ambulatory Visit: Payer: Self-pay

## 2017-07-23 ENCOUNTER — Ambulatory Visit (INDEPENDENT_AMBULATORY_CARE_PROVIDER_SITE_OTHER): Payer: 59 | Admitting: Family Medicine

## 2017-07-23 ENCOUNTER — Encounter: Payer: Self-pay | Admitting: Family Medicine

## 2017-07-23 VITALS — BP 130/70 | HR 67 | Ht 75.0 in | Wt 227.0 lb

## 2017-07-23 DIAGNOSIS — M25522 Pain in left elbow: Secondary | ICD-10-CM

## 2017-07-23 DIAGNOSIS — S42435A Nondisplaced fracture (avulsion) of lateral epicondyle of left humerus, initial encounter for closed fracture: Secondary | ICD-10-CM | POA: Diagnosis not present

## 2017-07-23 MED ORDER — VITAMIN D (ERGOCALCIFEROL) 1.25 MG (50000 UNIT) PO CAPS: 50000.0000 [IU] | ORAL_CAPSULE | ORAL | 0 refills | Status: DC

## 2017-07-23 MED ORDER — NITROGLYCERIN 0.2 MG/HR TD PT24: MEDICATED_PATCH | TRANSDERMAL | 1 refills | Status: DC

## 2017-07-23 NOTE — Patient Instructions (Signed)
Good to see you.  You have a tear of the tendon and the avulsion fracture.  Ice 20 minutes 2 times daily. Usually after activity and before bed. Once weekly vitamin D for 12 weeks.  Wear wrist brace day and night for 1 week then nightly for 2 weeks.  We will limit lifting, no overhand lifting at all.  Exercises 3 times a week.  OK to play tennis but watch the toss.  See me again in 4 weeks.

## 2017-07-23 NOTE — Assessment & Plan Note (Signed)
Patient does have an avulsion fracture with an extensor common tendon tear. Started on nitroglycerin and warned of potential side effect. Once weekly vitamin D given, we discussed icing regimen and home exercises. Discussed which activities to do a which was to avoid. Patient put in a wrist immobilizer for short course of time. We'll be on light duty at work for 2 weeks. Follow-up again in 4 weeks

## 2017-07-31 ENCOUNTER — Telehealth: Payer: Self-pay

## 2017-07-31 NOTE — Telephone Encounter (Signed)
Left message asking patient to call back to schedule nurse visit to get first injection of shingrix---or call back if no longer interested so that another patient can be called to come in---let tamara know if patient calls back so that 2 vaccines can be labeled and placed in cindy's refrigerator

## 2017-08-06 NOTE — Telephone Encounter (Signed)
Left 2nd voicemail asking patient to call back if he is still interested in getting shingrix---this will be my final time reaching out to patient before moving forward with my waitlist so that someone else can have the opportunity to get shingrix ---can talk with Ladarius Seubert if any questions---if patient calls back to schedule, let Tranise Forrest know so that vaccines can be labeled with patient's name

## 2017-08-13 DIAGNOSIS — N402 Nodular prostate without lower urinary tract symptoms: Secondary | ICD-10-CM | POA: Diagnosis not present

## 2017-08-21 ENCOUNTER — Ambulatory Visit (INDEPENDENT_AMBULATORY_CARE_PROVIDER_SITE_OTHER): Payer: 59 | Admitting: Family Medicine

## 2017-08-21 ENCOUNTER — Ambulatory Visit (INDEPENDENT_AMBULATORY_CARE_PROVIDER_SITE_OTHER)
Admission: RE | Admit: 2017-08-21 | Discharge: 2017-08-21 | Disposition: A | Discharge status: Home / Self Care | Payer: 59 | Source: Ambulatory Visit | Attending: Family Medicine | Admitting: Family Medicine

## 2017-08-21 ENCOUNTER — Ambulatory Visit: Payer: 59 | Admitting: Family Medicine

## 2017-08-21 ENCOUNTER — Ambulatory Visit: Payer: Self-pay

## 2017-08-21 ENCOUNTER — Encounter: Payer: Self-pay | Admitting: Family Medicine

## 2017-08-21 VITALS — BP 122/72 | HR 65 | Ht 75.0 in | Wt 228.0 lb

## 2017-08-21 DIAGNOSIS — S42435D Nondisplaced fracture (avulsion) of lateral epicondyle of left humerus, subsequent encounter for fracture with routine healing: Secondary | ICD-10-CM

## 2017-08-21 DIAGNOSIS — M25529 Pain in unspecified elbow: Secondary | ICD-10-CM

## 2017-08-21 DIAGNOSIS — M25522 Pain in left elbow: Secondary | ICD-10-CM | POA: Diagnosis not present

## 2017-08-21 NOTE — Progress Notes (Signed)
[name] [place],  [id number] Phone: [phone] Subjective:    I'm seeing this patient by the request  of:    CC: Left elbow follow-up  ACZ:[id number]  Wayne [name] is a 61 y.o. male coming in left elbow pain follow up. He does feel that he is improved but is still having pain over the olecranon with elbow flexion. He said that stretching does seem to alleviate some of the pain. Patient was found to have the common extensor tendon tear as well as an avulsion fracture. States that he is feeling 50% better overall. States that it seems to be increasing range of motion. Less tenderness with regular daily activities. No side effects to the nitroglycerin     Past Medical History:  Diagnosis Date  . Arthritis   . Hyperlipidemia    slightly elevated, no meds  . Varicose veins    Past Surgical History:  Procedure Laterality Date  . ENDOVENOUS ABLATION SAPHENOUS VEIN W/ LASER Left 08-10-2009   left greater saphenous vein  . ENDOVENOUS ABLATION SAPHENOUS VEIN W/ LASER Left 10-08-2013   EVLA left proximal greater saphenous vein and stab phlebectomy 10-20 incisions left leg by Curt Jews MD  . VASECTOMY     Social History   Social History  . Marital status: Married    Spouse name: N/A  . Number of children: 2  . Years of education: N/A   Social History Main Topics  . Smoking status: Never Smoker  . Smokeless tobacco: Never Used  . Alcohol use 1.8 oz/week    3 Cans of beer per week  . Drug use: No  . Sexual activity: Yes   Other Topics Concern  . Not on file   Social History Narrative   Regular Exercise- no   No Known Allergies Family History  Problem Relation Age of Onset  . Diabetes Father   . Hypertension Mother   . Colon cancer Neg Hx   . Esophageal cancer Neg Hx   . Rectal cancer Neg Hx   . Stomach cancer Neg Hx      Past medical history, social, surgical and family history all reviewed in electronic  medical record.  No pertanent information unless stated regarding to the chief complaint.   Review of Systems:Review of systems updated and as accurate as of 08/21/17  No headache, visual changes, nausea, vomiting, diarrhea, constipation, dizziness, abdominal pain, skin rash, fevers, chills, night sweats, weight loss, swollen lymph nodes, body aches, joint swelling, muscle aches, chest pain, shortness of breath, mood changes.   Objective  There were no vitals taken for this visit. Systems examined below as of 08/21/17   General: No apparent distress alert and oriented x3 mood and affect normal, dressed appropriately.  HEENT: Pupils equal, extraocular movements intact  Respiratory: Patient's speak in full sentences and does not appear short of breath  Cardiovascular: No lower extremity edema, non tender, no erythema  Skin: Warm dry intact with no signs of infection or rash on extremities or on axial skeleton.  Abdomen: Soft nontender  Neuro: Cranial nerves II through XII are intact, neurovascularly intact in all extremities with 2+ DTRs and 2+ pulses.  Lymph: No lymphadenopathy of posterior or anterior cervical chain or axillae bilaterally.  Gait normal with good balance and coordination.  MSK:  Non tender with full range of motion and good stability and symmetric strength and tone of shoulders,  wrist, hip, knee and ankles bilaterally.  Elbow:  Left Unremarkable to inspection. Range of motion full pronation, supination, flexion, extension. Strength is full to all of the above directions Stable to varus, valgus stress. Negative moving valgus stress test. Pain still over the lateral epicondylar region but improved pain with resisted wrist extension still noted Ulnar nerve does not sublux. Negative cubital tunnel Tinel's. Contralateral elbow unremarkable   Musculoskeletal ultrasound was performed and interpreted by Charlann Boxer D.O.   Elbow:  Lateral epicondyle and common extensor tendon  origin visualized. There seems to be healing but still some hypoechoic changes within the surrounding area. No avulsion fracture seen previously as well healed at this time. Significant increase in Doppler flow noted. Trace effusion of the joint IMPRESSION:  Interval healing of the lateral epicondylar region Impression and Recommendations:     This case required medical decision making of moderate complexity.      Note: This dictation was prepared with Dragon dictation along with smaller phrase technology. Any transcriptional errors that result from this process are unintentional.

## 2017-08-21 NOTE — Patient Instructions (Signed)
Good to see you  Xray downstairs You are making progress Continue the nitro and the exercises We have other tricks if we need it  Keep hands within peripheral vision.  See me again in 6 weeks and you should be just about there!

## 2017-08-21 NOTE — Progress Notes (Signed)
I

## 2017-08-21 NOTE — Assessment & Plan Note (Signed)
Bone appears to be healing well at this time. Small joint effusion so x-rays ordered today. We discussed icing regimen and continuing the other conservative therapy. Patient declined formal physical therapy. We'll continue nitroglycerin at the same dose. I do hope patient will continue to improve and follow-up again in 4-6 weeks.

## 2017-08-29 DIAGNOSIS — N402 Nodular prostate without lower urinary tract symptoms: Secondary | ICD-10-CM | POA: Diagnosis not present

## 2017-09-10 DIAGNOSIS — N402 Nodular prostate without lower urinary tract symptoms: Secondary | ICD-10-CM | POA: Diagnosis not present

## 2017-09-10 DIAGNOSIS — R361 Hematospermia: Secondary | ICD-10-CM | POA: Diagnosis not present

## 2017-09-30 ENCOUNTER — Encounter: Payer: Self-pay | Admitting: Family Medicine

## 2017-10-02 ENCOUNTER — Ambulatory Visit: Payer: 59 | Admitting: Family Medicine

## 2017-10-11 ENCOUNTER — Other Ambulatory Visit: Payer: Self-pay | Admitting: Family Medicine

## 2017-10-15 MED ORDER — VITAMIN D (ERGOCALCIFEROL) 1.25 MG (50000 UNIT) PO CAPS: ORAL_CAPSULE | ORAL | 0 refills | Status: DC

## 2017-10-15 NOTE — Telephone Encounter (Signed)
Refill done.  

## 2018-01-09 ENCOUNTER — Other Ambulatory Visit: Payer: Self-pay | Admitting: Family Medicine

## 2018-01-10 NOTE — Telephone Encounter (Signed)
Refill denied. Pt completed course of treatment.  

## 2018-02-11 ENCOUNTER — Encounter: Payer: Self-pay | Admitting: Internal Medicine

## 2018-02-11 ENCOUNTER — Ambulatory Visit: Payer: 59 | Admitting: Internal Medicine

## 2018-02-11 VITALS — BP 122/72 | HR 58 | Temp 98.2°F | Ht 75.0 in | Wt 228.0 lb

## 2018-02-11 DIAGNOSIS — M25522 Pain in left elbow: Secondary | ICD-10-CM | POA: Diagnosis not present

## 2018-02-11 DIAGNOSIS — M542 Cervicalgia: Secondary | ICD-10-CM

## 2018-02-11 DIAGNOSIS — D485 Neoplasm of uncertain behavior of skin: Secondary | ICD-10-CM

## 2018-02-11 DIAGNOSIS — C4490 Unspecified malignant neoplasm of skin, unspecified: Secondary | ICD-10-CM | POA: Diagnosis not present

## 2018-02-11 MED ORDER — NITROGLYCERIN 0.2 MG/HR TD PT24: MEDICATED_PATCH | TRANSDERMAL | 1 refills | Status: DC

## 2018-02-11 MED ORDER — VITAMIN D3 50 MCG (2000 UT) PO CAPS: 2000.0000 [IU] | ORAL_CAPSULE | Freq: Every day | ORAL | 3 refills | Status: DC

## 2018-02-11 MED ORDER — IBUPROFEN 600 MG PO TABS: 600.0000 mg | ORAL_TABLET | Freq: Three times a day (TID) | ORAL | 5 refills | Status: DC | PRN

## 2018-02-11 NOTE — Assessment & Plan Note (Signed)
a lesion on the L lower eyelid x months - r/o skin cancer Skin surg ref

## 2018-02-11 NOTE — Assessment & Plan Note (Signed)
NTG patch

## 2018-02-11 NOTE — Assessment & Plan Note (Signed)
Ibuprofen Rx. 

## 2018-02-11 NOTE — Progress Notes (Signed)
Subjective:  Patient ID: Wayne [name], male    DOB: December 14, 1955  Age: 62 y.o. MRN: [id number]  CC: No chief complaint on file.   HPI SRIJAN GIVAN presents for lesion on the L lower eyelid x months C/o neck pain, L elbow pain  Outpatient Medications Prior to Visit  Medication Sig Dispense Refill  . clotrimazole-betamethasone (LOTRISONE) cream Apply topically 2 (two) times daily. To flaky skin patches 45 g 1  . ibuprofen (ADVIL,MOTRIN) 600 MG tablet Take 1 tablet (600 mg total) by mouth every 8 (eight) hours as needed for pain. 60 tablet 5  . nitroGLYCERIN (NITRODUR - DOSED IN MG/24 HR) 0.2 mg/hr patch 1/4 patch daily 30 patch 1  . Vitamin D, Ergocalciferol, (DRISDOL) 50000 units CAPS capsule TAKE 1 CAPSULE BY MOUTH EVERY 7 DAYS 12 capsule 0   No facility-administered medications prior to visit.     ROS Review of Systems  Constitutional: Negative for appetite change, fatigue and unexpected weight change.  HENT: Negative for congestion, nosebleeds, sneezing, sore throat and trouble swallowing.   Eyes: Negative for itching and visual disturbance.  Respiratory: Negative for cough.   Cardiovascular: Negative for chest pain, palpitations and leg swelling.  Gastrointestinal: Negative for abdominal distention, blood in stool, diarrhea and nausea.  Genitourinary: Negative for frequency and hematuria.  Musculoskeletal: Positive for arthralgias and neck pain. Negative for back pain, gait problem and joint swelling.  Skin: Positive for color change. Negative for rash.  Neurological: Negative for dizziness, tremors, speech difficulty and weakness.  Psychiatric/Behavioral: Negative for agitation, dysphoric mood and sleep disturbance. The patient is not nervous/anxious.     Objective:  There were no vitals taken for this visit.  BP Readings from Last 3 Encounters:  08/21/17 122/72  07/23/17 130/70  06/26/17 110/70    Wt Readings from Last 3 Encounters:  08/21/17 228 lb (103.4 kg)    07/23/17 227 lb (103 kg)  06/26/17 222 lb (100.7 kg)    Physical Exam  Constitutional: He is oriented to person, place, and time. He appears well-developed. No distress.  NAD  HENT:  Mouth/Throat: Oropharynx is clear and moist.  Eyes: Pupils are equal, round, and reactive to light. Conjunctivae are normal.  Neck: Normal range of motion. No JVD present. No thyromegaly present.  Cardiovascular: Normal rate, regular rhythm, normal heart sounds and intact distal pulses. Exam reveals no gallop and no friction rub.  No murmur heard. Pulmonary/Chest: Effort normal and breath sounds normal. No respiratory distress. He has no wheezes. He has no rales. He exhibits no tenderness.  Abdominal: Soft. Bowel sounds are normal. He exhibits no distension and no mass. There is no tenderness. There is no rebound and no guarding.  Musculoskeletal: Normal range of motion. He exhibits no edema or tenderness.  Lymphadenopathy:    He has no cervical adenopathy.  Neurological: He is alert and oriented to person, place, and time. He has normal reflexes. No cranial nerve deficit. He exhibits normal muscle tone. He displays a negative Romberg sign. Coordination and gait normal.  Skin: Skin is warm and dry. No rash noted.  Oval 6x4 mm lesion on the L lower eyelid  Psychiatric: He has a normal mood and affect. His behavior is normal. Judgment and thought content normal.    Lab Results  Component Value Date   WBC 5.5 06/26/2017   HGB 13.9 06/26/2017   HCT 41.1 06/26/2017   PLT 242.0 06/26/2017   GLUCOSE 99 06/26/2017   CHOL 209 (H) 06/26/2017  TRIG 85.0 06/26/2017   HDL 47.90 06/26/2017   LDLDIRECT 155.3 05/05/2013   LDLCALC 144 (H) 06/26/2017   ALT 19 06/26/2017   AST 21 06/26/2017   NA 141 06/26/2017   K 4.4 06/26/2017   CL 104 06/26/2017   CREATININE 0.82 06/26/2017   BUN 14 06/26/2017   CO2 28 06/26/2017   TSH 1.62 06/26/2017   PSA 0.40 06/26/2017    Dg Elbow Complete Left  Result Date:  08/21/2017 CLINICAL DATA:  Left elbow pain for 3 months. No known injury. Initial encounter. EXAM: LEFT ELBOW - COMPLETE 3+ VIEW COMPARISON:  None. FINDINGS: There is no evidence of fracture, dislocation, or joint effusion. There is no evidence of arthropathy or other focal bone abnormality. Mild spurring at the triceps tendon insertion on the olecranon is noted. Soft tissues are unremarkable. IMPRESSION: Mild spurring at the triceps tendon insertion on the olecranon. The study is otherwise negative. Electronically Signed   By: Inge Rise M.D.   On: 08/21/2017 16:41   [place] Limited Joint Space Structures Low Left  Result Date: 09/04/2017 Musculoskeletal ultrasound was performed and interpreted by Charlann Boxer D.O.  Elbow: Lateral epicondyle and common extensor tendon origin visualized. There seems to be healing but still some hypoechoic changes within the surrounding area. No avulsion fracture seen previously as well healed at this time. Significant increase in Doppler flow noted. Trace effusion of the joint   Interval healing of the lateral epicondylar region   Assessment & Plan:   There are no diagnoses linked to this encounter. I am having Uzziel L. Kampf maintain his clotrimazole-betamethasone, ibuprofen, nitroGLYCERIN, and Vitamin D (Ergocalciferol).  No orders of the defined types were placed in this encounter.    Follow-up: No follow-ups on file.  Walker Kehr, MD

## 2018-04-01 DIAGNOSIS — C441192 Basal cell carcinoma of skin of left lower eyelid, including canthus: Secondary | ICD-10-CM | POA: Diagnosis not present

## 2018-04-01 DIAGNOSIS — D485 Neoplasm of uncertain behavior of skin: Secondary | ICD-10-CM | POA: Diagnosis not present

## 2018-06-03 DIAGNOSIS — C441192 Basal cell carcinoma of skin of left lower eyelid, including canthus: Secondary | ICD-10-CM | POA: Diagnosis not present

## 2019-04-17 ENCOUNTER — Ambulatory Visit (INDEPENDENT_AMBULATORY_CARE_PROVIDER_SITE_OTHER): Payer: 59 | Admitting: Internal Medicine

## 2019-04-17 ENCOUNTER — Encounter: Payer: Self-pay | Admitting: Internal Medicine

## 2019-04-17 ENCOUNTER — Other Ambulatory Visit: Payer: Self-pay

## 2019-04-17 DIAGNOSIS — N451 Epididymitis: Secondary | ICD-10-CM | POA: Insufficient documentation

## 2019-04-17 MED ORDER — DOXYCYCLINE HYCLATE 100 MG PO TABS: 100.0000 mg | ORAL_TABLET | Freq: Two times a day (BID) | ORAL | 1 refills | Status: DC

## 2019-04-17 MED ORDER — IBUPROFEN 600 MG PO TABS: 600.0000 mg | ORAL_TABLET | Freq: Three times a day (TID) | ORAL | 5 refills | Status: DC | PRN

## 2019-04-17 NOTE — Assessment & Plan Note (Signed)
Recurrent L Doxy Support underwear Ibuprofen prn

## 2019-04-17 NOTE — Progress Notes (Signed)
Virtual Visit via Video Note  I connected with Wayne [name] on 04/17/19 at  4:00 PM EDT by a video enabled telemedicine application and verified that I am speaking with the correct person using two identifiers.   I discussed the limitations of evaluation and management by telemedicine and the availability of in person appointments. The patient expressed understanding and agreed to proceed.  History of Present Illness: Wayne [name] is complaining of pain in the left testicles off-and-on of 2 months duration.  He has a history of epididymitis, 3 4 attacks in the past few years.  Normally course of doxycycline for 2 weeks helps him well.  No fever.  He has been playing tennis 3 times a week  There has been no runny nose, cough, chest pain, shortness of breath, abdominal pain, diarrhea, constipation, arthralgias, skin rashes.   Observations/Objective: The patient appears to be in no acute distress, looks well.  Assessment and Plan:  See my Assessment and Plan. Follow Up Instructions:    I discussed the assessment and treatment plan with the patient. The patient was provided an opportunity to ask questions and all were answered. The patient agreed with the plan and demonstrated an understanding of the instructions.   The patient was advised to call back or seek an in-person evaluation if the symptoms worsen or if the condition fails to improve as anticipated.  I provided face-to-face time during this encounter. We were at different locations.   Walker Kehr, MD

## 2019-05-18 ENCOUNTER — Ambulatory Visit: Payer: Self-pay | Admitting: Internal Medicine

## 2019-05-18 NOTE — Telephone Encounter (Signed)
Pt calling after hours to schedule appt. Saw Dr. Alain Marion 04/17/2019 'Epididymitis' left testicle.  States "Not any better, no worse just not better."  Does states pain in left leg "Maybe a little worse" rates at 5/10. Denies fever, no swelling or redness. Pt states he is taking doxycycline as ordered.   Advised [place] would route to practice for Dr. Judeen Hammans review.  Care advise given, advised to go to ED if symptoms worsen. Pt verbalizes understanding.  Please advise: [phone]  Reason for Disposition . [1] Brief pain in scrotum or testicle AND [2] present < 1 hour AND [3] recurrent  (NO swelling)  Answer Assessment - Initial Assessment Questions 1. LOCATION and RADIATION: "Where is the pain located?"      Left testicle 2. QUALITY: "What does the pain feel like?"  (e.g., sharp, dull, aching, burning)     *No Answer* 3. SEVERITY: "How bad is the pain?"  (Scale 1-10; or mild, moderate, severe)   - MILD (1-3): doesn't interfere with normal activities    - MODERATE (4-7): interferes with normal activities (e.g., work or school) or awakens from sleep   - SEVERE (8-10): excruciating pain, unable to do any normal activities, difficulty walking     5/10 4. ONSET: "When did the pain start?"    "Months ago" 5. PATTERN: "Does it come and go, or has it been constant since it started?"     *No Answer* 6. SCROTAL APPEARANCE: "What does the scrotum look like?" "Is there any swelling or redness?"      no 7. HERNIA: "Has a doctor ever told you that you have a hernia?"     *No Answer* 8. OTHER SYMPTOMS: "Do you have any other symptoms?" (e.g., fever, abdominal pain, vomiting, difficulty passing urine)    "Leg pain maybe a little owrse"  Protocols used: SCROTAL PAIN-A-AH

## 2019-05-19 NOTE — Telephone Encounter (Signed)
LVM for patient to call back and set up an appointment.  

## 2019-05-21 ENCOUNTER — Encounter: Payer: Self-pay | Admitting: Internal Medicine

## 2019-05-21 ENCOUNTER — Other Ambulatory Visit: Payer: Self-pay

## 2019-05-21 ENCOUNTER — Ambulatory Visit (INDEPENDENT_AMBULATORY_CARE_PROVIDER_SITE_OTHER): Payer: 59 | Admitting: Internal Medicine

## 2019-05-21 VITALS — BP 124/64 | HR 64 | Temp 97.5°F | Ht 75.0 in | Wt 227.0 lb

## 2019-05-21 DIAGNOSIS — Z0000 Encounter for general adult medical examination without abnormal findings: Secondary | ICD-10-CM

## 2019-05-21 DIAGNOSIS — Z23 Encounter for immunization: Secondary | ICD-10-CM

## 2019-05-21 DIAGNOSIS — N451 Epididymitis: Secondary | ICD-10-CM

## 2019-05-21 MED ORDER — MELOXICAM 15 MG PO TABS: 15.0000 mg | ORAL_TABLET | Freq: Every day | ORAL | 1 refills | Status: DC

## 2019-05-21 MED ORDER — CIPROFLOXACIN HCL 500 MG PO TABS: 500.0000 mg | ORAL_TABLET | Freq: Two times a day (BID) | ORAL | 0 refills | Status: DC

## 2019-05-21 NOTE — Progress Notes (Signed)
Subjective:  Patient ID: Wayne [name], male    DOB: [birth year]  Age: 63 y.o. MRN: [id number]  CC: No chief complaint on file.   HPI ANDRZEJ SCULLY presents for L testis, L groin and L thigh pain Pt took Doxy - better but not well yet... Saw Dr Gloriann Loan for prostate bx (-)  Outpatient Medications Prior to Visit  Medication Sig Dispense Refill  . Cholecalciferol (VITAMIN D3) 2000 units capsule Take 1 capsule (2,000 Units total) by mouth daily. 100 capsule 3  . doxycycline (VIBRA-TABS) 100 MG tablet Take 1 tablet (100 mg total) by mouth 2 (two) times daily. 28 tablet 1  . ibuprofen (ADVIL) 600 MG tablet Take 1 tablet (600 mg total) by mouth every 8 (eight) hours as needed for moderate pain. 60 tablet 5  . nitroGLYCERIN (NITRODUR - DOSED IN MG/24 HR) 0.2 mg/hr patch 1/4 patch daily 30 patch 1   No facility-administered medications prior to visit.     ROS: Review of Systems  Constitutional: Negative for appetite change, fatigue and unexpected weight change.  HENT: Negative for congestion, nosebleeds, sneezing, sore throat and trouble swallowing.   Eyes: Negative for itching and visual disturbance.  Respiratory: Negative for cough.   Cardiovascular: Negative for chest pain, palpitations and leg swelling.  Gastrointestinal: Negative for abdominal distention, blood in stool, diarrhea and nausea.  Genitourinary: Positive for testicular pain. Negative for discharge, frequency, hematuria, penile pain, penile swelling, scrotal swelling and urgency.  Musculoskeletal: Negative for back pain, gait problem, joint swelling and neck pain.  Skin: Negative for rash.  Neurological: Negative for dizziness, tremors, speech difficulty and weakness.  Psychiatric/Behavioral: Negative for agitation, dysphoric mood and sleep disturbance. The patient is not nervous/anxious.     Objective:  BP 124/64 (BP Location: Left Arm, Patient Position: Sitting, Cuff Size: Large)   Pulse 64   Temp (!) 97.5 F (36.4 C)  (Oral)   Ht 6\' 3"  (1.905 m)   Wt 227 lb (103 kg)   SpO2 96%   BMI 28.37 kg/m   BP Readings from Last 3 Encounters:  05/21/19 124/64  02/11/18 122/72  08/21/17 122/72    Wt Readings from Last 3 Encounters:  05/21/19 227 lb (103 kg)  02/11/18 228 lb (103.4 kg)  08/21/17 228 lb (103.4 kg)    Physical Exam Constitutional:      General: He is not in acute distress.    Appearance: He is well-developed.     Comments: NAD  Eyes:     Conjunctiva/sclera: Conjunctivae normal.     Pupils: Pupils are equal, round, and reactive to light.  Neck:     Musculoskeletal: Normal range of motion.     Thyroid: No thyromegaly.     Vascular: No JVD.  Cardiovascular:     Rate and Rhythm: Normal rate and regular rhythm.     Heart sounds: Normal heart sounds. No murmur. No friction rub. No gallop.   Pulmonary:     Effort: Pulmonary effort is normal. No respiratory distress.     Breath sounds: Normal breath sounds. No wheezing or rales.  Chest:     Chest wall: No tenderness.  Abdominal:     General: Bowel sounds are normal. There is no distension.     Palpations: Abdomen is soft. There is no mass.     Tenderness: There is no abdominal tenderness. There is no guarding or rebound.  Musculoskeletal: Normal range of motion.        General: No tenderness.  Lymphadenopathy:     Cervical: No cervical adenopathy.  Skin:    General: Skin is warm and dry.     Findings: No rash.  Neurological:     Mental Status: He is alert and oriented to person, place, and time.     Cranial Nerves: No cranial nerve deficit.     Motor: No abnormal muscle tone.     Coordination: Coordination normal.     Gait: Gait normal.     Deep Tendon Reflexes: Reflexes are normal and symmetric.  Psychiatric:        Behavior: Behavior normal.        Thought Content: Thought content normal.        Judgment: Judgment normal.    L testis - sensitive. No mass   Lab Results  Component Value Date   WBC 5.5 06/26/2017   HGB  13.9 06/26/2017   HCT 41.1 06/26/2017   PLT 242.0 06/26/2017   GLUCOSE 99 06/26/2017   CHOL 209 (H) 06/26/2017   TRIG 85.0 06/26/2017   HDL 47.90 06/26/2017   LDLDIRECT 155.3 05/05/2013   LDLCALC 144 (H) 06/26/2017   ALT 19 06/26/2017   AST 21 06/26/2017   NA 141 06/26/2017   K 4.4 06/26/2017   CL 104 06/26/2017   CREATININE 0.82 06/26/2017   BUN 14 06/26/2017   CO2 28 06/26/2017   TSH 1.62 06/26/2017   PSA 0.40 06/26/2017    Dg Elbow Complete Left  Result Date: 08/21/2017 CLINICAL DATA:  Left elbow pain for 3 months. No known injury. Initial encounter. EXAM: LEFT ELBOW - COMPLETE 3+ VIEW COMPARISON:  None. FINDINGS: There is no evidence of fracture, dislocation, or joint effusion. There is no evidence of arthropathy or other focal bone abnormality. Mild spurring at the triceps tendon insertion on the olecranon is noted. Soft tissues are unremarkable. IMPRESSION: Mild spurring at the triceps tendon insertion on the olecranon. The study is otherwise negative. Electronically Signed   By: Inge Rise M.D.   On: 08/21/2017 16:41   [place] Limited Joint Space Structures Low Left  Result Date: 09/04/2017 Musculoskeletal ultrasound was performed and interpreted by Charlann Boxer D.O.  Elbow: Lateral epicondyle and common extensor tendon origin visualized. There seems to be healing but still some hypoechoic changes within the surrounding area. No avulsion fracture seen previously as well healed at this time. Significant increase in Doppler flow noted. Trace effusion of the joint   Interval healing of the lateral epicondylar region   Assessment & Plan:   There are no diagnoses linked to this encounter.   No orders of the defined types were placed in this encounter.    Follow-up: No follow-ups on file.  Walker Kehr, MD

## 2019-05-21 NOTE — Addendum Note (Signed)
Addended by: Karren Cobble on: 05/21/2019 02:51 PM   Modules accepted: Orders

## 2019-05-21 NOTE — Assessment & Plan Note (Signed)
Cipro Probiotics

## 2019-07-07 ENCOUNTER — Ambulatory Visit (INDEPENDENT_AMBULATORY_CARE_PROVIDER_SITE_OTHER): Payer: 59 | Admitting: Internal Medicine

## 2019-07-07 ENCOUNTER — Encounter: Payer: Self-pay | Admitting: Internal Medicine

## 2019-07-07 ENCOUNTER — Other Ambulatory Visit: Payer: Self-pay

## 2019-07-07 DIAGNOSIS — L259 Unspecified contact dermatitis, unspecified cause: Secondary | ICD-10-CM | POA: Insufficient documentation

## 2019-07-07 DIAGNOSIS — L237 Allergic contact dermatitis due to plants, except food: Secondary | ICD-10-CM

## 2019-07-07 MED ORDER — PREDNISONE 10 MG PO TABS: ORAL_TABLET | ORAL | 0 refills | Status: DC

## 2019-07-07 NOTE — Progress Notes (Signed)
Virtual Visit via Video Note  I connected with Wayne [name] on 07/07/19 at  3:45 PM EDT by a video enabled telemedicine application and verified that I am speaking with the correct person using two identifiers.   I discussed the limitations of evaluation and management by telemedicine and the availability of in person appointments. The patient expressed understanding and agreed to proceed.  The patient is currently at home and I am in the office.    No referring provider.    History of Present Illness: This is an acute visit for poison ivy.  He has been working in the yard and yesterday he noticed what he feels is poison ivy in his bilateral wrists, arms and fingers.  The rash is itchy.  He has had poison ivy in the past and this rash is consistent with his prior episodes.  It has been a while since he has had it.  He does have in his yard.  He does have one area that looks like a blister, but no discharge or concerning open wounds.  He has not had any fevers or chills.  He has tried some topical medications, but in the past has always required steroids and was hoping to get a prescription today.  He has tolerated the steroids in the past without side effects.  He has no other concerns.  Review of Systems  Constitutional: Negative for chills and fever.  Skin: Positive for itching and rash.  Neurological: Negative for sensory change.      Social History   Socioeconomic History  . Marital status: Married    Spouse name: Not on file  . Number of children: 2  . Years of education: Not on file  . Highest education level: Not on file  Occupational History  . Not on file  Social Needs  . Financial resource strain: Not on file  . Food insecurity    Worry: Not on file    Inability: Not on file  . Transportation needs    Medical: Not on file    Non-medical: Not on file  Tobacco Use  . Smoking status: Never Smoker  . Smokeless tobacco: Never Used  Substance and Sexual Activity   . Alcohol use: Yes    Alcohol/week: 3.0 standard drinks    Types: 3 Cans of beer per week  . Drug use: No  . Sexual activity: Yes  Lifestyle  . Physical activity    Days per week: Not on file    Minutes per session: Not on file  . Stress: Not on file  Relationships  . Social Herbalist on phone: Not on file    Gets together: Not on file    Attends religious service: Not on file    Active member of club or organization: Not on file    Attends meetings of clubs or organizations: Not on file    Relationship status: Not on file  Other Topics Concern  . Not on file  Social History Narrative   Regular Exercise- no     Observations/Objective: Appears well in NAD Able to faintly see the rash, but because of poor quality video unable to see well  Assessment and Plan:  See Problem List for Assessment and Plan of chronic medical problems.   Follow Up Instructions:    I discussed the assessment and treatment plan with the patient. The patient was provided an opportunity to ask questions and all were answered. The patient agreed with the  plan and demonstrated an understanding of the instructions.   The patient was advised to call back or seek an in-person evaluation if the symptoms worsen or if the condition fails to improve as anticipated.    Binnie Rail, MD

## 2019-07-07 NOTE — Assessment & Plan Note (Signed)
History and symptoms consistent with poison ivy dermatitis, which she has had in the past Topical medications not effective in the past he has required oral steroids.  Given the distribution in bilateral upper extremities we will go ahead and order a short course of prednisone Discussed that he can continue use topical medications if needed Take prednisone with food.  Discussed possible side effects Call if no improvement

## 2019-09-10 ENCOUNTER — Ambulatory Visit (INDEPENDENT_AMBULATORY_CARE_PROVIDER_SITE_OTHER): Payer: 59 | Admitting: Internal Medicine

## 2019-09-10 ENCOUNTER — Encounter: Payer: Self-pay | Admitting: Internal Medicine

## 2019-09-10 ENCOUNTER — Other Ambulatory Visit: Payer: Self-pay

## 2019-09-10 ENCOUNTER — Other Ambulatory Visit (INDEPENDENT_AMBULATORY_CARE_PROVIDER_SITE_OTHER): Payer: 59

## 2019-09-10 VITALS — BP 118/66 | HR 57 | Temp 97.9°F | Ht 75.0 in | Wt 224.0 lb

## 2019-09-10 DIAGNOSIS — Z0000 Encounter for general adult medical examination without abnormal findings: Secondary | ICD-10-CM

## 2019-09-10 DIAGNOSIS — M79605 Pain in left leg: Secondary | ICD-10-CM | POA: Diagnosis not present

## 2019-09-10 DIAGNOSIS — E785 Hyperlipidemia, unspecified: Secondary | ICD-10-CM | POA: Diagnosis not present

## 2019-09-10 LAB — CBC WITH DIFFERENTIAL/PLATELET
Basophils Absolute: 0.1 10*3/uL (ref 0.0–0.1)
Basophils Relative: 1.1 % (ref 0.0–3.0)
Eosinophils Absolute: 0.2 10*3/uL (ref 0.0–0.7)
Eosinophils Relative: 2.9 % (ref 0.0–5.0)
HCT: 40.7 % (ref 39.0–52.0)
Hemoglobin: 13.5 g/dL (ref 13.0–17.0)
Lymphocytes Relative: 28.9 % (ref 12.0–46.0)
Lymphs Abs: 2.3 10*3/uL (ref 0.7–4.0)
MCHC: 33.2 g/dL (ref 30.0–36.0)
MCV: 91 fl (ref 78.0–100.0)
Monocytes Absolute: 0.8 10*3/uL (ref 0.1–1.0)
Monocytes Relative: 10.1 % (ref 3.0–12.0)
Neutro Abs: 4.6 10*3/uL (ref 1.4–7.7)
Neutrophils Relative %: 57 % (ref 43.0–77.0)
Platelets: 259 10*3/uL (ref 150.0–400.0)
RBC: 4.47 Mil/uL (ref 4.22–5.81)
RDW: 13.2 % (ref 11.5–15.5)
WBC: 8.1 10*3/uL (ref 4.0–10.5)

## 2019-09-10 LAB — HEPATIC FUNCTION PANEL
ALT: 21 U/L (ref 0–53)
AST: 20 U/L (ref 0–37)
Albumin: 4.6 g/dL (ref 3.5–5.2)
Alkaline Phosphatase: 65 U/L (ref 39–117)
Bilirubin, Direct: 0.1 mg/dL (ref 0.0–0.3)
Total Bilirubin: 0.5 mg/dL (ref 0.2–1.2)
Total Protein: 6.9 g/dL (ref 6.0–8.3)

## 2019-09-10 LAB — BASIC METABOLIC PANEL
BUN: 17 mg/dL (ref 6–23)
CO2: 29 mEq/L (ref 19–32)
Calcium: 9.7 mg/dL (ref 8.4–10.5)
Chloride: 102 mEq/L (ref 96–112)
Creatinine, Ser: 0.83 mg/dL (ref 0.40–1.50)
GFR: 93.98 mL/min (ref >60.00)
Glucose, Bld: 87 mg/dL (ref 70–99)
Potassium: 4.2 mEq/L (ref 3.5–5.1)
Sodium: 138 mEq/L (ref 135–145)

## 2019-09-10 LAB — LIPID PANEL
Cholesterol: 243 mg/dL — ABNORMAL HIGH (ref 0–200)
HDL: 53.6 mg/dL (ref >39.00)
LDL Cholesterol: 165 mg/dL — ABNORMAL HIGH (ref 0–99)
NonHDL: 189.77
Total CHOL/HDL Ratio: 5
Triglycerides: 122 mg/dL (ref 0.0–149.0)
VLDL: 24.4 mg/dL (ref 0.0–40.0)

## 2019-09-10 LAB — URINALYSIS
Bilirubin Urine: NEGATIVE
Hgb urine dipstick: NEGATIVE
Ketones, ur: NEGATIVE
Leukocytes,Ua: NEGATIVE
Nitrite: NEGATIVE
Specific Gravity, Urine: 1.01 (ref 1.000–1.030)
Total Protein, Urine: NEGATIVE
Urine Glucose: NEGATIVE
Urobilinogen, UA: 0.2 (ref 0.0–1.0)
pH: 7 (ref 5.0–8.0)

## 2019-09-10 LAB — PSA: PSA: 0.35 ng/mL (ref 0.10–4.00)

## 2019-09-10 LAB — TSH: TSH: 1.66 u[IU]/mL (ref 0.35–4.50)

## 2019-09-10 MED ORDER — MELOXICAM 15 MG PO TABS: 15.0000 mg | ORAL_TABLET | Freq: Every day | ORAL | 1 refills | Status: DC

## 2019-09-10 NOTE — Progress Notes (Signed)
Subjective:  Patient ID: Wayne [name], male    DOB: 02/20/1956  Age: 62 y.o. MRN: [id number]  CC: No chief complaint on file.   HPI Wayne [name] presents for a well exam C/o L groin pain x 2 mo; no injury - tennis 2-3 /week The patient has been playing tennis  Outpatient Medications Prior to Visit  Medication Sig Dispense Refill  . Cholecalciferol (VITAMIN D3) 2000 units capsule Take 1 capsule (2,000 Units total) by mouth daily. 100 capsule 3  . meloxicam (MOBIC) 15 MG tablet Take 1 tablet (15 mg total) by mouth daily. 30 tablet 1  . ciprofloxacin (CIPRO) 500 MG tablet Take 1 tablet (500 mg total) by mouth 2 (two) times daily. 28 tablet 0  . predniSONE (DELTASONE) 10 MG tablet Take 4 tabs po qd x 2 days, then 3 tabs po qd x 2 days, then 2 tabs po qd x 2 days, then 1 tab po qd x 2 days 30 tablet 0   No facility-administered medications prior to visit.     ROS: Review of Systems  Constitutional: Negative for appetite change, fatigue and unexpected weight change.  HENT: Negative for congestion, nosebleeds, sneezing, sore throat and trouble swallowing.   Eyes: Negative for itching and visual disturbance.  Respiratory: Negative for cough.   Cardiovascular: Negative for chest pain, palpitations and leg swelling.  Gastrointestinal: Negative for abdominal distention, blood in stool, diarrhea and nausea.  Genitourinary: Negative for frequency and hematuria.  Musculoskeletal: Positive for arthralgias. Negative for back pain, gait problem, joint swelling and neck pain.  Skin: Negative for rash.  Neurological: Negative for dizziness, tremors, speech difficulty and weakness.  Psychiatric/Behavioral: Negative for agitation, dysphoric mood, sleep disturbance and suicidal ideas. The patient is not nervous/anxious.     Objective:  BP 118/66 (BP Location: Left Arm, Patient Position: Sitting, Cuff Size: Large)   Pulse (!) 57   Temp 97.9 F (36.6 C) (Oral)   Ht 6\' 3"  (1.905 m)   Wt 224 lb  (101.6 kg)   SpO2 98%   BMI 28.00 kg/m   BP Readings from Last 3 Encounters:  09/10/19 118/66  05/21/19 124/64  02/11/18 122/72    Wt Readings from Last 3 Encounters:  09/10/19 224 lb (101.6 kg)  05/21/19 227 lb (103 kg)  02/11/18 228 lb (103.4 kg)    Physical Exam Constitutional:      General: He is not in acute distress.    Appearance: He is well-developed.     Comments: NAD  Eyes:     Conjunctiva/sclera: Conjunctivae normal.     Pupils: Pupils are equal, round, and reactive to light.  Neck:     Musculoskeletal: Normal range of motion.     Thyroid: No thyromegaly.     Vascular: No JVD.  Cardiovascular:     Rate and Rhythm: Normal rate and regular rhythm.     Heart sounds: Normal heart sounds. No murmur. No friction rub. No gallop.   Pulmonary:     Effort: Pulmonary effort is normal. No respiratory distress.     Breath sounds: Normal breath sounds. No wheezing or rales.  Chest:     Chest wall: No tenderness.  Abdominal:     General: Bowel sounds are normal. There is no distension.     Palpations: Abdomen is soft. There is no mass.     Tenderness: There is no abdominal tenderness. There is no guarding or rebound.  Musculoskeletal: Normal range of motion.  General: No tenderness.  Lymphadenopathy:     Cervical: No cervical adenopathy.  Skin:    General: Skin is warm and dry.     Findings: No rash.  Neurological:     Mental Status: He is alert and oriented to person, place, and time.     Cranial Nerves: No cranial nerve deficit.     Motor: No abnormal muscle tone.     Coordination: Coordination normal.     Gait: Gait normal.     Deep Tendon Reflexes: Reflexes are normal and symmetric.  Psychiatric:        Behavior: Behavior normal.        Thought Content: Thought content normal.        Judgment: Judgment normal.   No groin pain or hernia on palpation  Lab Results  Component Value Date   WBC 5.5 06/26/2017   HGB 13.9 06/26/2017   HCT 41.1  06/26/2017   PLT 242.0 06/26/2017   GLUCOSE 99 06/26/2017   CHOL 209 (H) 06/26/2017   TRIG 85.0 06/26/2017   HDL 47.90 06/26/2017   LDLDIRECT 155.3 05/05/2013   LDLCALC 144 (H) 06/26/2017   ALT 19 06/26/2017   AST 21 06/26/2017   NA 141 06/26/2017   K 4.4 06/26/2017   CL 104 06/26/2017   CREATININE 0.82 06/26/2017   BUN 14 06/26/2017   CO2 28 06/26/2017   TSH 1.62 06/26/2017   PSA 0.40 06/26/2017    Dg Elbow Complete Left  Result Date: 08/21/2017 CLINICAL DATA:  Left elbow pain for 3 months. No known injury. Initial encounter. EXAM: LEFT ELBOW - COMPLETE 3+ VIEW COMPARISON:  None. FINDINGS: There is no evidence of fracture, dislocation, or joint effusion. There is no evidence of arthropathy or other focal bone abnormality. Mild spurring at the triceps tendon insertion on the olecranon is noted. Soft tissues are unremarkable. IMPRESSION: Mild spurring at the triceps tendon insertion on the olecranon. The study is otherwise negative. Electronically Signed   By: Inge Rise M.D.   On: 08/21/2017 16:41   [place] Limited Joint Space Structures Low Left  Result Date: 09/04/2017 Musculoskeletal ultrasound was performed and interpreted by Charlann Boxer D.O.  Elbow: Lateral epicondyle and common extensor tendon origin visualized. There seems to be healing but still some hypoechoic changes within the surrounding area. No avulsion fracture seen previously as well healed at this time. Significant increase in Doppler flow noted. Trace effusion of the joint   Interval healing of the lateral epicondylar region   Assessment & Plan:   There are no diagnoses linked to this encounter.   No orders of the defined types were placed in this encounter.    Follow-up: No follow-ups on file.  Walker Kehr, MD

## 2019-09-10 NOTE — Assessment & Plan Note (Addendum)
We discussed age appropriate health related issues, including available/recomended screening tests and vaccinations. We discussed a need for adhering to healthy diet and exercise. Labs/EKG were reviewed/ordered. All questions were answered. Form filled out.  CT scan for calcium scoring offered  Colon due in 2025 Declined tDAP, flu shot Shingrix  

## 2019-09-10 NOTE — Assessment & Plan Note (Addendum)
ileo-psoas stretch Ice/heat Sports medicine consultation if not better

## 2019-09-10 NOTE — Assessment & Plan Note (Addendum)
The patient is not interested in statins.  CT scan for calcium scoring offered

## 2019-09-10 NOTE — Patient Instructions (Signed)
Look up ileo-psoas stretch    Mediterranean diet is good for you. (Wayne [name] has a typical Mediterranean cuisine menu) The Mediterranean diet is a way of eating based on the traditional cuisine of countries bordering the [employer]. While there is no single definition of the Mediterranean diet, it is typically high in vegetables, fruits, whole grains, beans, nut and seeds, and olive oil. The main components of Mediterranean diet include: Marland Kitchen Daily consumption of vegetables, fruits, whole grains and healthy fats  . Weekly intake of fish, poultry, beans and eggs  . Moderate portions of dairy products  . Limited intake of red meat Other important elements of the Mediterranean diet are sharing meals with family and friends, enjoying a glass of red wine and being physically active. Health benefits of a Mediterranean diet: A traditional Mediterranean diet consisting of large quantities of fresh fruits and vegetables, nuts, fish and olive oil-coupled with physical activity-can reduce your risk of serious mental and physical health problems by: Preventing heart disease and strokes. Following a Mediterranean diet limits your intake of refined breads, processed foods, and red meat, and encourages drinking red wine instead of hard liquor-all factors that can help prevent heart disease and stroke. Keeping you agile. If you're an older adult, the nutrients gained with a Mediterranean diet may reduce your risk of developing muscle weakness and other signs of frailty by about 70 percent. Reducing the risk of Alzheimer's. Research suggests that the Doyle diet may improve cholesterol, blood sugar levels, and overall blood vessel health, which in turn may reduce your risk of Alzheimer's disease or dementia. Halving the risk of Parkinson's disease. The high levels of antioxidants in the Mediterranean diet can prevent cells from undergoing a damaging process called oxidative stress, thereby cutting the  risk of Parkinson's disease in half. Increasing longevity. By reducing your risk of developing heart disease or cancer with the Mediterranean diet, you're reducing your risk of death at any age by 20%. Protecting against type 2 diabetes. A Mediterranean diet is rich in fiber which digests slowly, prevents huge swings in blood sugar, and can help you maintain a healthy weight.  Cardiac CT calcium scoring test $150 Tel # is [phone]   Computed tomography, more commonly known as a CT or CAT scan, is a diagnostic medical imaging test. Like traditional x-rays, it produces multiple images or pictures of the inside of the body. The cross-sectional images generated during a CT scan can be reformatted in multiple planes. They can even generate three-dimensional images. These images can be viewed on a computer monitor, printed on film or by a 3D printer, or transferred to a CD or DVD. CT images of internal organs, bones, soft tissue and blood vessels provide greater detail than traditional x-rays, particularly of soft tissues and blood vessels. A cardiac CT scan for coronary calcium is a non-invasive way of obtaining information about the presence, location and extent of calcified plaque in the coronary arteries-the vessels that supply oxygen-containing blood to the heart muscle. Calcified plaque results when there is a build-up of fat and other substances under the inner layer of the artery. This material can calcify which signals the presence of atherosclerosis, a disease of the vessel wall, also called coronary artery disease (CAD). People with this disease have an increased risk for heart attacks. In addition, over time, progression of plaque build up (CAD) can narrow the arteries or even close off blood flow to the heart. The result may be chest pain, sometimes called "angina,"  or a heart attack. Because calcium is a marker of CAD, the amount of calcium detected on a cardiac CT scan is a helpful  prognostic tool. The findings on cardiac CT are expressed as a calcium score. Another name for this test is coronary artery calcium scoring.  What are some common uses of the procedure? The goal of cardiac CT scan for calcium scoring is to determine if CAD is present and to what extent, even if there are no symptoms. It is a screening study that may be recommended by a physician for patients with risk factors for CAD but no clinical symptoms. The major risk factors for CAD are: . high blood cholesterol levels  . family history of heart attacks  . diabetes  . high blood pressure  . cigarette smoking  . overweight or obese  . physical inactivity   A negative cardiac CT scan for calcium scoring shows no calcification within the coronary arteries. This suggests that CAD is absent or so minimal it cannot be seen by this technique. The chance of having a heart attack over the next two to five years is very low under these circumstances. A positive test means that CAD is present, regardless of whether or not the patient is experiencing any symptoms. The amount of calcification-expressed as the calcium score-may help to predict the likelihood of a myocardial infarction (heart attack) in the coming years and helps your medical doctor or cardiologist decide whether the patient may need to take preventive medicine or undertake other measures such as diet and exercise to lower the risk for heart attack. The extent of CAD is graded according to your calcium score:  Calcium Score  Presence of CAD (coronary artery disease)  0 No evidence of CAD   1-10 Minimal evidence of CAD  11-100 Mild evidence of CAD  101-400 Moderate evidence of CAD  Over 400 Extensive evidence of CAD

## 2019-09-13 ENCOUNTER — Encounter: Payer: Self-pay | Admitting: Internal Medicine

## 2020-04-25 ENCOUNTER — Ambulatory Visit: Payer: Self-pay

## 2020-04-25 ENCOUNTER — Encounter: Payer: Self-pay | Admitting: Family Medicine

## 2020-04-25 ENCOUNTER — Ambulatory Visit (INDEPENDENT_AMBULATORY_CARE_PROVIDER_SITE_OTHER): Payer: 59

## 2020-04-25 ENCOUNTER — Ambulatory Visit: Payer: 59 | Admitting: Internal Medicine

## 2020-04-25 ENCOUNTER — Other Ambulatory Visit: Payer: Self-pay

## 2020-04-25 ENCOUNTER — Ambulatory Visit: Payer: 59 | Admitting: Family Medicine

## 2020-04-25 VITALS — BP 120/68 | HR 73 | Ht 75.0 in | Wt 224.0 lb

## 2020-04-25 DIAGNOSIS — M79604 Pain in right leg: Secondary | ICD-10-CM

## 2020-04-25 NOTE — Patient Instructions (Signed)
Thank you for coming in today. Get xray today and plan for physical therapy.  Recheck in 6 weeks or so especially if not better.    Lumbosacral Radiculopathy Lumbosacral radiculopathy is a condition that involves the spinal nerves and nerve roots in the low back and bottom of the spine. The condition develops when these nerves and nerve roots move out of place or become inflamed and cause symptoms. What are the causes? This condition may be caused by:  Pressure from a disk that bulges out of place (herniated disk). A disk is a plate of soft cartilage that separates bones in the spine.  Disk changes that occur with age (disk degeneration).  A narrowing of the bones of the lower back (spinal stenosis).  A tumor.  An infection.  An injury that places sudden pressure on the disks that cushion the bones of your lower spine. What increases the risk? You are more likely to develop this condition if:  You are a male who is 25-54 years old.  You are a male who is 51-58 years old.  You use improper technique when lifting things.  You are overweight or live a sedentary lifestyle.  Your work requires frequent lifting.  You smoke.  You do repetitive activities that strain the spine. What are the signs or symptoms? Symptoms of this condition include:  Pain that goes down from your back into your legs (sciatica), usually on one side of the body. This is the most common symptom. The pain may be worse with sitting, coughing, or sneezing.  Pain and numbness in your legs.  Muscle weakness.  Tingling.  Loss of bladder control or bowel control. How is this diagnosed? This condition may be diagnosed based on:  Your symptoms and medical history.  A physical exam. If the pain is lasting, you may have tests, such as:  MRI scan.  X-ray.  CT scan.  A type of X-ray used to examine the spinal canal after injecting a dye into your spine (myelogram).  A test to measure how  electrical impulses move through a nerve (nerve conduction study). How is this treated? Treatment may depend on the cause of the condition and may include:  Working with a physical therapist.  Taking pain medicine.  Applying heat and ice to affected areas.  Doing stretches to improve flexibility.  Doing exercises to strengthen back muscles.  Having chiropractic spinal manipulation.  Using transcutaneous electrical nerve stimulation (TENS) therapy.  Getting a steroid injection in the spine. In some cases, no treatment is needed. If the condition is long-lasting (chronic), or if symptoms are severe, treatment may involve surgery or lifestyle changes, such as following a weight-loss plan. Follow these instructions at home: Activity  Avoid bending and other activities that make the problem worse.  Maintain a proper position when standing or sitting: ? When standing, keep your upper back and neck straight, with your shoulders pulled back. Avoid slouching. ? When sitting, keep your back straight and relax your shoulders. Do not round your shoulders or pull them backward.  Do not sit or stand in one place for long periods of time.  Take brief periods of rest throughout the day. This will reduce your pain. It is usually better to rest by lying down or standing, not sitting.  When you are resting for longer periods, mix in some mild activity or stretching between periods of rest. This will help to prevent stiffness and pain.  Get regular exercise. Ask your health care provider  what activities are safe for you. If you were shown how to do any exercises or stretches, do them as directed by your health care provider.  Do not lift anything that is heavier than 10 lb (4.5 kg) or the limit that you are told by your health care provider. Always use proper lifting technique, which includes: ? Bending your knees. ? Keeping the load close to your body. ? Avoiding twisting. Managing pain  If  directed, put ice on the affected area: ? Put ice in a plastic bag. ? Place a towel between your skin and the bag. ? Leave the ice on for 20 minutes, 2-3 times a day.  If directed, apply heat to the affected area as often as told by your health care provider. Use the heat source that your health care provider recommends, such as a moist heat pack or a heating pad. ? Place a towel between your skin and the heat source. ? Leave the heat on for 20-30 minutes. ? Remove the heat if your skin turns bright red. This is especially important if you are unable to feel pain, heat, or cold. You may have a greater risk of getting burned.  Take over-the-counter and prescription medicines only as told by your health care provider. General instructions  Sleep on a firm mattress in a comfortable position. Try lying on your side with your knees slightly bent. If you lie on your back, put a pillow under your knees.  Do not drive or use heavy machinery while taking prescription pain medicine.  If your health care provider prescribed a diet or exercise program, follow it as directed.  Keep all follow-up visits as told by your health care provider. This is important. Contact a health care provider if:  Your pain does not improve over time, even when taking pain medicines. Get help right away if:  You develop severe pain.  Your pain suddenly gets worse.  You develop increasing weakness in your legs.  You lose the ability to control your bladder or bowel.  You have difficulty walking or balancing.  You have a fever. Summary  Lumbosacral radiculopathy is a condition that occurs when the spinal nerves and nerve roots in the lower part of the spine move out of place or become inflamed and cause symptoms.  Symptoms include pain, numbness, and tingling that go down from your back into your legs (sciatica), muscle weakness, and loss of bladder control or bowel control.  If directed, apply ice or heat to  the affected area as told by your health care provider.  Follow instructions about activity, rest, and proper lifting technique. This information is not intended to replace advice given to you by your health care provider. Make sure you discuss any questions you have with your health care provider. Document Revised: 10/10/2017 Document Reviewed: 10/10/2017 Elsevier Patient Education  [place].

## 2020-04-25 NOTE — Progress Notes (Signed)
I, Wendy Poet, LAT, ATC, am serving as scribe for Dr. Lynne Leader.  Wayne [name] is a 64 y.o. male who presents to [facility] today for R leg pain.  He was last seen by Dr. Tamala Julian on 08/21/17 for his L elbow.  Today, he reports R leg pain x couple monts.  Pain is located at the anterior hip into the medial hip.  He notes only a little bit of pain at the posterior thigh/hip area.  He also notes some pain in the lateral calf on the right.  He notes pain is worse when he stands from a seated position for the first few steps.  He also notes pain and difficulty with prolonged seated positioning especially to his hip.  He has difficulty getting his socks on.  Radiating pain: yes up to the R hip Swelling: No Aggravating factors: lumbar flexion; first moments of standing from sitting; stairs Treatments tried: stretching; IBU   Pertinent review of systems: No fevers or chills  Relevant historical information: Hyperlipidemia   Exam:  BP 120/68 (BP Location: Right Arm, Patient Position: Sitting, Cuff Size: Large)   Pulse 73   Ht 6\' 3"  (1.905 m)   Wt 224 lb (101.6 kg)   SpO2 98%   BMI 28.00 kg/m  General: Well Developed, well nourished, and in no acute distress.   MSK:  Right hip normal-appearing Decreased range of motion to internal rotation and flexion with pain. Adduction also produces pain. Hip abduction strength diminished external rotation 4/5.  4+/5 abduction.  L-spine normal-appearing nontender.  Normal motion to extension rotation lateral flexion diminished flexion. Negative slump test and straight leg raise test. Lower extremity reflexes and sensation are intact distally.  Strength is intact except for noted above.     Lab and Radiology Results  X-ray images right hip and L-spine obtained today personally and independently reviewed..  Right hip: Moderate femoral acetabular DJD.  No acute fractures.  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   Neuroforaminal stenosis present at these levels as well.  No acute fractures.  Await formal radiology review  Assessment and Plan: 64 y.o. male with right anterior to medial thigh pain likely due to interarticular hip cause such as DJD seen on x-ray today.  For most tender impingement is also probable.  Trial of physical therapy but I am not super optimistic about this being all that helpful.  Likely will need hip replacement at some point in the near future.  Check back in 6 weeks trial diagnostic and therapeutic injection at that time likely.  Right lateral calf pain: Concerning for L5 radiculopathy.  Patient certainly does have degenerative changes at this level.  Hopefully physical therapy will be helpful.  Again if not better will proceed with further evaluation including likely MRI for epidural steroid injection planning.    Orders Placed This Encounter  Procedures  . DG HIP UNILAT WITH PELVIS 2-3 VIEWS RIGHT    Standing Status:   Future    Number of Occurrences:   1    Standing Expiration Date:   04/25/2021    Order Specific Question:   Reason for Exam (SYMPTOM  OR DIAGNOSIS REQUIRED)    Answer:   eval right hip    Order Specific Question:   Preferred imaging location?    Answer:   [facility]    Order Specific Question:   Radiology Contrast Protocol - do NOT remove file path    Answer:   \\  [email or web address]  . DG Lumbar Spine 2-3 Views    Standing Status:   Future    Number of Occurrences:   1    Standing Expiration Date:   04/25/2021    Order Specific Question:   Reason for Exam (SYMPTOM  OR DIAGNOSIS REQUIRED)    Answer:   eval possibly right L5 and L2 radiculopathy on the right    Order Specific Question:   Preferred imaging location?    Answer:   [facility]    Order Specific Question:   Radiology Contrast Protocol - do NOT remove file path    Answer:   [email or web address]  .  Ambulatory referral to Physical Therapy    Referral Priority:   Routine    Referral Type:   Physical Medicine    Referral Reason:   Specialty Services Required    Requested Specialty:   Physical Therapy    Number of Visits Requested:   1   No orders of the defined types were placed in this encounter.    Discussed warning signs or symptoms. Please see discharge instructions. Patient expresses understanding.   The above documentation has been reviewed and is accurate and complete Lynne Leader, M.D.

## 2020-04-27 NOTE — Progress Notes (Signed)
X-ray right hip shows some mild arthritis.  It is possible you have worse arthritis that we can see causing some of the hip pain.  I am more optimistic about physical therapy based on the x-ray results.  If not improved with physical therapy could consider injection or hip MRI arthrogram to further evaluate the cause of hip pain.

## 2020-04-27 NOTE — Progress Notes (Signed)
X-ray lumbar spine just shows some back arthritis at L4-L5 and L5-S1.  These are areas were your nerve is likely to be pinched and corresponds to the pain you have down your leg.  If we need to MRI will be helpful in the future.

## 2020-06-06 ENCOUNTER — Other Ambulatory Visit: Payer: Self-pay

## 2020-06-06 ENCOUNTER — Encounter: Payer: Self-pay | Admitting: Family Medicine

## 2020-06-06 ENCOUNTER — Ambulatory Visit: Payer: 59 | Admitting: Family Medicine

## 2020-06-06 DIAGNOSIS — M5416 Radiculopathy, lumbar region: Secondary | ICD-10-CM | POA: Diagnosis not present

## 2020-06-06 MED ORDER — PREDNISONE 50 MG PO TABS
ORAL_TABLET | ORAL | 0 refills | Status: DC
Start: 2020-06-06 — End: 2020-06-07

## 2020-06-06 MED ORDER — GABAPENTIN 100 MG PO CAPS
200.0000 mg | ORAL_CAPSULE | Freq: Every day | ORAL | 0 refills | Status: DC
Start: 2020-06-06 — End: 2020-06-07

## 2020-06-06 NOTE — Progress Notes (Signed)
[place] [address] [place] Phone: [phone] Subjective:   I Wayne [name] am serving as a Education administrator for Dr. Hulan Saas.  This visit occurred during the SARS-CoV-2 public health emergency.  Safety protocols were in place, including screening questions prior to the visit, additional usage of staff PPE, and extensive cleaning of exam room while observing appropriate contact time as indicated for disinfecting solutions.   I'm seeing this patient by the request  of:  Plotnikov, Evie Lacks, MD  CC: Back pain and leg pain follow-up  VPX:[id number]  Wayne [name] is a 64 y.o. male coming in with complaint of right leg pain. Seen on 04/25/2020 by Dr. Georgina Snell for lumbar radiculopathy. Patient started PT at [facility] on 05/12/2020. Patient states the pain has been going on for about 8 weeks. Believes he injured himself playing tennis. Lateral lower leg pain. Has been going to PT and states he feels a little better. Using icy hot and ibuprofen for pain. Patient states that it is very uncomfortable but not debilitating.  Patient is denies any nighttime awakening, denies any fevers chills or any abnormal weight loss     Past Medical History:  Diagnosis Date  . Arthritis   . Hyperlipidemia    slightly elevated, no meds  . Varicose veins    Past Surgical History:  Procedure Laterality Date  . CATARACT EXTRACTION Right   . ENDOVENOUS ABLATION SAPHENOUS VEIN W/ LASER Left 08-10-2009   left greater saphenous vein  . ENDOVENOUS ABLATION SAPHENOUS VEIN W/ LASER Left 10-08-2013   EVLA left proximal greater saphenous vein and stab phlebectomy 10-20 incisions left leg by Curt Jews MD  . VASECTOMY     Social History   Socioeconomic History  . Marital status: Married    Spouse name: Not on file  . Number of children: 2  . Years of education: Not on file  . Highest education level: Not on file  Occupational History  . Not on file  Tobacco Use    . Smoking status: Never Smoker  . Smokeless tobacco: Never Used  Substance and Sexual Activity  . Alcohol use: Yes    Alcohol/week: 3.0 standard drinks    Types: 3 Cans of beer per week  . Drug use: No  . Sexual activity: Yes  Other Topics Concern  . Not on file  Social History Narrative   Regular Exercise- no   Social Determinants of Health   Financial Resource Strain:   . Difficulty of Paying Living Expenses:   Food Insecurity:   . Worried About Charity fundraiser in the Last Year:   . Arboriculturist in the Last Year:   Transportation Needs:   . Film/video editor (Medical):   Marland Kitchen Lack of Transportation (Non-Medical):   Physical Activity:   . Days of Exercise per Week:   . Minutes of Exercise per Session:   Stress:   . Feeling of Stress :   Social Connections:   . Frequency of Communication with Friends and Family:   . Frequency of Social Gatherings with Friends and Family:   . Attends Religious Services:   . Active Member of Clubs or Organizations:   . Attends Archivist Meetings:   Marland Kitchen Marital Status:    No Known Allergies Family History  Problem Relation Age of Onset  . Diabetes Father   . Hypertension Mother   . Colon cancer Neg Hx   . Esophageal  cancer Neg Hx   . Rectal cancer Neg Hx   . Stomach cancer Neg Hx     Current Outpatient Medications (Endocrine & Metabolic):  .  predniSONE (DELTASONE) 50 MG tablet, Take one tablet daily for the next 5 days.    Current Outpatient Medications (Analgesics):  .  meloxicam (MOBIC) 15 MG tablet, Take 1 tablet (15 mg total) by mouth daily.   Current Outpatient Medications (Other):  Marland Kitchen  Cholecalciferol (VITAMIN D3) 2000 units capsule, Take 1 capsule (2,000 Units total) by mouth daily. Marland Kitchen  gabapentin (NEURONTIN) 100 MG capsule, Take 2 capsules (200 mg total) by mouth at bedtime.   Reviewed prior external information including notes and imaging from  primary care provider As well as notes that were  available from care everywhere and other healthcare systems.  Past medical history, social, surgical and family history all reviewed in electronic medical record.  No pertanent information unless stated regarding to the chief complaint.   Review of Systems:  No headache, visual changes, nausea, vomiting, diarrhea, constipation, dizziness, abdominal pain, skin rash, fevers, chills, night sweats, weight loss, swollen lymph nodes, body aches, joint swelling, chest pain, shortness of breath, mood changes. POSITIVE muscle aches  Objective  Blood pressure (!) 130/70, pulse 70, height 6\' 3"  (1.905 m), weight (!) 223 lb (101.2 kg), SpO2 95 %.   General: No apparent distress alert and oriented x3 mood and affect normal, dressed appropriately.  HEENT: Pupils equal, extraocular movements intact  Respiratory: Patient's speak in full sentences and does not appear short of breath  Cardiovascular: No lower extremity edema, non tender, no erythema  Neuro: Cranial nerves II through XII are intact, neurovascularly intact in all extremities with 2+ DTRs and 2+ pulses.  Gait normal with good balance and coordination.  MSK: Mild arthritic changes. Patient has negative Thompson test and no pain with compression of the calf noted.  Patient neurovascularly intact distally.  Negative straight leg test but does have tightness with FABER test.  Hypertrophy of the lateral compartment of the calf noted today.  Nontender though on exam and no masses appreciated.  Knee has full range of motion.   Impression and Recommendations:     The above documentation has been reviewed and is accurate and complete Wayne Pulley, DO       Note: This dictation was prepared with Dragon dictation along with smaller phrase technology. Any transcriptional errors that result from this process are unintentional.

## 2020-06-06 NOTE — Patient Instructions (Addendum)
Good to see you Prednisone Gabapentin See me or Dr. Georgina Snell again in 5-6 weeks

## 2020-06-06 NOTE — Assessment & Plan Note (Addendum)
Patient does have signs and symptoms that is consistent with more of a lumbar radiculopathy.  Patient does have hypertrophy of the lateral compartment of the calf muscle that could be contributing to exertional compartment syndrome as well.  Started on gabapentin, encouraged the meloxicam, discussed icing regimen and home exercises, which activities to do which wants to avoid.  Patient will continue with physical therapy as long as he sees that it is beneficial.  Follow-up with me 6 weeks worsening pain will need advanced imaging such as an MRI

## 2020-06-07 ENCOUNTER — Other Ambulatory Visit: Payer: Self-pay

## 2020-06-07 MED ORDER — GABAPENTIN 100 MG PO CAPS: 200.0000 mg | ORAL_CAPSULE | Freq: Every day | ORAL | 0 refills | Status: DC

## 2020-06-07 MED ORDER — PREDNISONE 50 MG PO TABS: ORAL_TABLET | ORAL | 0 refills | Status: DC

## 2020-06-19 ENCOUNTER — Other Ambulatory Visit: Payer: Self-pay | Admitting: Internal Medicine

## 2020-06-19 MED ORDER — SCOPOLAMINE 1 MG/3DAYS TD PT72: 1.0000 | MEDICATED_PATCH | TRANSDERMAL | 0 refills | Status: DC

## 2020-09-20 ENCOUNTER — Encounter: Payer: Self-pay | Admitting: Internal Medicine

## 2020-09-20 ENCOUNTER — Encounter: Payer: 59 | Admitting: Internal Medicine

## 2020-09-20 ENCOUNTER — Ambulatory Visit (INDEPENDENT_AMBULATORY_CARE_PROVIDER_SITE_OTHER): Payer: 59 | Admitting: Internal Medicine

## 2020-09-20 ENCOUNTER — Other Ambulatory Visit: Payer: Self-pay

## 2020-09-20 VITALS — BP 128/70 | HR 77 | Temp 97.8°F | Ht 75.0 in | Wt 207.8 lb

## 2020-09-20 DIAGNOSIS — N486 Induration penis plastica: Secondary | ICD-10-CM | POA: Insufficient documentation

## 2020-09-20 DIAGNOSIS — E785 Hyperlipidemia, unspecified: Secondary | ICD-10-CM

## 2020-09-20 DIAGNOSIS — Z0000 Encounter for general adult medical examination without abnormal findings: Secondary | ICD-10-CM | POA: Diagnosis not present

## 2020-09-20 DIAGNOSIS — Z0001 Encounter for general adult medical examination with abnormal findings: Secondary | ICD-10-CM

## 2020-09-20 MED ORDER — VITAMIN E 180 MG (400 UNIT) PO CAPS: 400.0000 [IU] | ORAL_CAPSULE | Freq: Every day | ORAL | 3 refills | Status: DC

## 2020-09-20 NOTE — Assessment & Plan Note (Signed)
We discussed age appropriate health related issues, including available/recomended screening tests and vaccinations. We discussed a need for adhering to healthy diet and exercise. Labs/EKG were reviewed/ordered. All questions were answered. Form filled out.  CT scan for calcium scoring offered  Colon due in 2025 Declined tDAP, flu shot Shingrix

## 2020-09-20 NOTE — Progress Notes (Signed)
Subjective:  Patient ID: Wayne [name], male    DOB: 08-02-1956  Age: 64 y.o. MRN: [id number]  CC: Annual Exam   HPI Wayne [name] presents for a well exam Lost wt on diet.  He is complaining of increased curvature of his penis with erection over past several weeks or months.  No pain.  Outpatient Medications Prior to Visit  Medication Sig Dispense Refill  . Cholecalciferol (VITAMIN D3) 2000 units capsule Take 1 capsule (2,000 Units total) by mouth daily. 100 capsule 3  . gabapentin (NEURONTIN) 100 MG capsule Take 2 capsules (200 mg total) by mouth at bedtime. (Patient not taking: Reported on 09/20/2020) 180 capsule 0  . meloxicam (MOBIC) 15 MG tablet Take 1 tablet (15 mg total) by mouth daily. (Patient not taking: Reported on 09/20/2020) 30 tablet 1  . predniSONE (DELTASONE) 50 MG tablet Take one tablet daily for the next 5 days. (Patient not taking: Reported on 09/20/2020) 5 tablet 0  . scopolamine (TRANSDERM-SCOP, 1.5 MG,) 1 MG/3DAYS Place 1 patch (1.5 mg total) onto the skin every 3 (three) days. (Patient not taking: Reported on 09/20/2020) 8 patch 0   No facility-administered medications prior to visit.    ROS: Review of Systems  Constitutional: Negative for appetite change, fatigue and unexpected weight change.  HENT: Negative for congestion, nosebleeds, sneezing, sore throat and trouble swallowing.   Eyes: Negative for itching and visual disturbance.  Respiratory: Negative for cough.   Cardiovascular: Negative for chest pain, palpitations and leg swelling.  Gastrointestinal: Negative for abdominal distention, blood in stool, diarrhea and nausea.  Genitourinary: Negative for frequency and hematuria.  Musculoskeletal: Negative for back pain, gait problem, joint swelling and neck pain.  Skin: Negative for rash.  Neurological: Negative for dizziness, tremors, speech difficulty and weakness.  Psychiatric/Behavioral: Negative for agitation, dysphoric mood and sleep disturbance.  The patient is not nervous/anxious.     Objective:  BP 128/70 (BP Location: Left Arm)   Pulse 77   Temp 97.8 F (36.6 C) (Oral)   Ht 6\' 3"  (1.905 m)   Wt 207 lb 12.8 oz (94.3 kg)   SpO2 96%   BMI 25.97 kg/m   BP Readings from Last 3 Encounters:  09/20/20 128/70  06/06/20 (!) 130/70  04/25/20 120/68    Wt Readings from Last 3 Encounters:  09/20/20 207 lb 12.8 oz (94.3 kg)  06/06/20 (!) 223 lb (101.2 kg)  04/25/20 224 lb (101.6 kg)    Physical Exam Constitutional:      General: He is not in acute distress.    Appearance: He is well-developed.     Comments: NAD  Eyes:     Conjunctiva/sclera: Conjunctivae normal.     Pupils: Pupils are equal, round, and reactive to light.  Neck:     Thyroid: No thyromegaly.     Vascular: No JVD.  Cardiovascular:     Rate and Rhythm: Normal rate and regular rhythm.     Heart sounds: Normal heart sounds. No murmur heard.  No friction rub. No gallop.   Pulmonary:     Effort: Pulmonary effort is normal. No respiratory distress.     Breath sounds: Normal breath sounds. No wheezing or rales.  Chest:     Chest wall: No tenderness.  Abdominal:     General: Bowel sounds are normal. There is no distension.     Palpations: Abdomen is soft. There is no mass.     Tenderness: There is no abdominal tenderness. There is no guarding  or rebound.  Genitourinary:    Penis: Normal.      Prostate: Normal.     Rectum: Normal.  Musculoskeletal:        General: No tenderness. Normal range of motion.     Cervical back: Normal range of motion.  Lymphadenopathy:     Cervical: No cervical adenopathy.  Skin:    General: Skin is warm and dry.     Findings: No rash.  Neurological:     Mental Status: He is alert and oriented to person, place, and time.     Cranial Nerves: No cranial nerve deficit.     Motor: No abnormal muscle tone.     Coordination: Coordination normal.     Gait: Gait normal.     Deep Tendon Reflexes: Reflexes are normal and  symmetric.  Psychiatric:        Behavior: Behavior normal.        Thought Content: Thought content normal.        Judgment: Judgment normal.    I spent 22 minutes in addition to time for CPX wellness examination in preparing to see the patient by review of recent labs, imaging and procedures, obtaining and reviewing separately obtained history, communicating with the patient, ordering medications, tests or procedures, and documenting clinical information in the EHR including the differential diagnosis, treatment, and any further evaluation and other management of Peironi's, elevated LDL        Lab Results  Component Value Date   WBC 8.1 09/10/2019   HGB 13.5 09/10/2019   HCT 40.7 09/10/2019   PLT 259.0 09/10/2019   GLUCOSE 87 09/10/2019   CHOL 243 (H) 09/10/2019   TRIG 122.0 09/10/2019   HDL 53.60 09/10/2019   LDLDIRECT 155.3 05/05/2013   LDLCALC 165 (H) 09/10/2019   ALT 21 09/10/2019   AST 20 09/10/2019   NA 138 09/10/2019   K 4.2 09/10/2019   CL 102 09/10/2019   CREATININE 0.83 09/10/2019   BUN 17 09/10/2019   CO2 29 09/10/2019   TSH 1.66 09/10/2019   PSA 0.35 09/10/2019    DG Elbow Complete Left  Result Date: 08/21/2017 CLINICAL DATA:  Left elbow pain for 3 months. No known injury. Initial encounter. EXAM: LEFT ELBOW - COMPLETE 3+ VIEW COMPARISON:  None. FINDINGS: There is no evidence of fracture, dislocation, or joint effusion. There is no evidence of arthropathy or other focal bone abnormality. Mild spurring at the triceps tendon insertion on the olecranon is noted. Soft tissues are unremarkable. IMPRESSION: Mild spurring at the triceps tendon insertion on the olecranon. The study is otherwise negative. Electronically Signed   By: Inge Rise M.D.   On: 08/21/2017 16:41   [place] LIMITED JOINT SPACE STRUCTURES LOW LEFT  Result Date: 09/04/2017 Musculoskeletal ultrasound was performed and interpreted by Charlann Boxer D.O.  Elbow: Lateral epicondyle and common extensor  tendon origin visualized. There seems to be healing but still some hypoechoic changes within the surrounding area. No avulsion fracture seen previously as well healed at this time. Significant increase in Doppler flow noted. Trace effusion of the joint   Interval healing of the lateral epicondylar region   Assessment & Plan:   There are no diagnoses linked to this encounter.  Walker Kehr, MD

## 2020-09-20 NOTE — Assessment & Plan Note (Signed)
2021 mild Vit E Urol ref if needed

## 2020-09-20 NOTE — Patient Instructions (Signed)

## 2020-09-23 ENCOUNTER — Other Ambulatory Visit (INDEPENDENT_AMBULATORY_CARE_PROVIDER_SITE_OTHER): Payer: 59

## 2020-09-23 ENCOUNTER — Other Ambulatory Visit: Payer: Self-pay

## 2020-09-23 DIAGNOSIS — Z0000 Encounter for general adult medical examination without abnormal findings: Secondary | ICD-10-CM | POA: Diagnosis not present

## 2020-09-23 DIAGNOSIS — E785 Hyperlipidemia, unspecified: Secondary | ICD-10-CM | POA: Diagnosis not present

## 2020-09-23 DIAGNOSIS — Z125 Encounter for screening for malignant neoplasm of prostate: Secondary | ICD-10-CM | POA: Diagnosis not present

## 2020-09-23 LAB — COMPREHENSIVE METABOLIC PANEL
ALT: 21 U/L (ref 0–53)
AST: 23 U/L (ref 0–37)
Albumin: 4.2 g/dL (ref 3.5–5.2)
Alkaline Phosphatase: 56 U/L (ref 39–117)
BUN: 11 mg/dL (ref 6–23)
CO2: 28 mEq/L (ref 19–32)
Calcium: 9.4 mg/dL (ref 8.4–10.5)
Chloride: 105 mEq/L (ref 96–112)
Creatinine, Ser: 0.73 mg/dL (ref 0.40–1.50)
GFR: 97.38 mL/min (ref >60.00)
Glucose, Bld: 89 mg/dL (ref 70–99)
Potassium: 4.4 mEq/L (ref 3.5–5.1)
Sodium: 140 mEq/L (ref 135–145)
Total Bilirubin: 0.6 mg/dL (ref 0.2–1.2)
Total Protein: 6.4 g/dL (ref 6.0–8.3)

## 2020-09-23 LAB — URINALYSIS
Bilirubin Urine: NEGATIVE
Hgb urine dipstick: NEGATIVE
Ketones, ur: NEGATIVE
Leukocytes,Ua: NEGATIVE
Nitrite: NEGATIVE
Specific Gravity, Urine: 1.02 (ref 1.000–1.030)
Total Protein, Urine: NEGATIVE
Urine Glucose: NEGATIVE
Urobilinogen, UA: 0.2 (ref 0.0–1.0)
pH: 7.5 (ref 5.0–8.0)

## 2020-09-23 LAB — CBC WITH DIFFERENTIAL/PLATELET
Basophils Absolute: 0.1 10*3/uL (ref 0.0–0.1)
Basophils Relative: 1.1 % (ref 0.0–3.0)
Eosinophils Absolute: 0.2 10*3/uL (ref 0.0–0.7)
Eosinophils Relative: 4.3 % (ref 0.0–5.0)
HCT: 39.4 % (ref 39.0–52.0)
Hemoglobin: 13.2 g/dL (ref 13.0–17.0)
Lymphocytes Relative: 30.3 % (ref 12.0–46.0)
Lymphs Abs: 1.6 10*3/uL (ref 0.7–4.0)
MCHC: 33.4 g/dL (ref 30.0–36.0)
MCV: 90 fl (ref 78.0–100.0)
Monocytes Absolute: 0.5 10*3/uL (ref 0.1–1.0)
Monocytes Relative: 10.3 % (ref 3.0–12.0)
Neutro Abs: 2.8 10*3/uL (ref 1.4–7.7)
Neutrophils Relative %: 54 % (ref 43.0–77.0)
Platelets: 232 10*3/uL (ref 150.0–400.0)
RBC: 4.38 Mil/uL (ref 4.22–5.81)
RDW: 13.2 % (ref 11.5–15.5)
WBC: 5.2 10*3/uL (ref 4.0–10.5)

## 2020-09-23 LAB — PSA: PSA: 0.34 ng/mL (ref 0.10–4.00)

## 2020-09-23 LAB — LIPID PANEL
Cholesterol: 189 mg/dL (ref 0–200)
HDL: 53.1 mg/dL (ref >39.00)
LDL Cholesterol: 128 mg/dL — ABNORMAL HIGH (ref 0–99)
NonHDL: 135.94
Total CHOL/HDL Ratio: 4
Triglycerides: 40 mg/dL (ref 0.0–149.0)
VLDL: 8 mg/dL (ref 0.0–40.0)

## 2020-09-23 LAB — TSH: TSH: 1.41 u[IU]/mL (ref 0.35–4.50)

## 2020-10-05 ENCOUNTER — Encounter: Payer: Self-pay | Admitting: Internal Medicine

## 2021-03-03 ENCOUNTER — Telehealth: Payer: Self-pay

## 2021-03-03 ENCOUNTER — Telehealth: Payer: 59 | Admitting: Emergency Medicine

## 2021-03-03 ENCOUNTER — Telehealth (INDEPENDENT_AMBULATORY_CARE_PROVIDER_SITE_OTHER): Payer: 59 | Admitting: Family

## 2021-03-03 ENCOUNTER — Encounter: Payer: Self-pay | Admitting: Family

## 2021-03-03 ENCOUNTER — Other Ambulatory Visit: Payer: Self-pay

## 2021-03-03 VITALS — Ht 74.0 in | Wt 205.0 lb

## 2021-03-03 DIAGNOSIS — U071 COVID-19: Secondary | ICD-10-CM

## 2021-03-03 MED ORDER — NIRMATRELVIR/RITONAVIR (PAXLOVID)TABLET: 3.0000 | ORAL_TABLET | Freq: Two times a day (BID) | ORAL | 0 refills | Status: AC

## 2021-03-03 MED ORDER — MAGIC MOUTHWASH: 5.0000 mL | Freq: Four times a day (QID) | ORAL | 0 refills | Status: DC | PRN

## 2021-03-03 NOTE — Telephone Encounter (Signed)
I have called the pt and left a message stating that the [facility] does have the magic mouthwash. They are not able to fill the nirmatrelvir/ritonavir EUA (PAXLOVID) TABS . Pt is able to go to the [facility] at [facility] in [place].

## 2021-03-03 NOTE — Progress Notes (Signed)
Wayne [name] is a 65 y.o. male with the following history as recorded in EpicCare:  Patient Active Problem List   Diagnosis Date Noted  . Peyronie disease 09/20/2020  . Right lumbar radiculopathy 06/06/2020  . Poison ivy dermatitis 07/07/2019  . Epididymitis 04/17/2019  . Closed nondisplaced avulsion fracture of lateral epicondyle of left humerus 07/23/2017  . Prostate nodule 06/26/2017  . Elbow pain, left 06/26/2017  . Varicose veins of lower extremities with other complications 71/04/2693  . Seborrheic dermatitis 02/20/2013  . Sebaceous cyst 02/20/2013  . Neck pain 05/20/2012  . Well adult exam 07/04/2011  . Dyslipidemia 07/04/2011  . NEOPLASM, SKIN, UNCERTAIN BEHAVIOR 85/46/2703  . VARICOSE VEINS, LOWER EXTREMITIES 02/01/2009  . LEG PAIN, LEFT 02/01/2009    Current Outpatient Medications  Medication Sig Dispense Refill  . magic mouthwash SOLN Take 5 mLs by mouth 4 (four) times daily as needed for mouth pain (swish/ swallow). Mix equal parts Maalox Extra Strength, Nystatin and Diphenhydramine 150 mL 0  . nirmatrelvir/ritonavir EUA (PAXLOVID) TABS Take 3 tablets by mouth 2 (two) times daily for 5 days. Patient GFR is 94. Take nirmatrelvir (150 mg) 2 tablet(s) twice daily for 5 days and ritonavir (100 mg) one tablet twice daily for 5 days. 30 tablet 0  . Cholecalciferol (VITAMIN D3) 2000 units capsule Take 1 capsule (2,000 Units total) by mouth daily. (Patient not taking: Reported on 03/03/2021) 100 capsule 3  . vitamin E (VITAMIN E) 180 MG (400 UNITS) capsule Take 1 capsule (400 Units total) by mouth daily. (Patient not taking: Reported on 03/03/2021) 100 capsule 3   No current facility-administered medications for this visit.    Allergies: Patient has no known allergies.  Past Medical History:  Diagnosis Date  . Arthritis   . Hyperlipidemia    slightly elevated, no meds  . Varicose veins     Past Surgical History:  Procedure Laterality Date  . CATARACT EXTRACTION Right    . ENDOVENOUS ABLATION SAPHENOUS VEIN W/ LASER Left 08-10-2009   left greater saphenous vein  . ENDOVENOUS ABLATION SAPHENOUS VEIN W/ LASER Left 10-08-2013   EVLA left proximal greater saphenous vein and stab phlebectomy 10-20 incisions left leg by Curt Jews MD  . VASECTOMY      Family History  Problem Relation Age of Onset  . Diabetes Father   . Hypertension Mother   . Colon cancer Neg Hx   . Esophageal cancer Neg Hx   . Rectal cancer Neg Hx   . Stomach cancer Neg Hx     Social History   Tobacco Use  . Smoking status: Never Smoker  . Smokeless tobacco: Never Used  Substance Use Topics  . Alcohol use: Yes    Alcohol/week: 3.0 standard drinks    Types: 3 Cans of beer per week    Subjective:   I connected with Wayne [name] on 03/03/21 at  2:40 PM EDT by a video enabled telemedicine application and verified that I am speaking with the correct person using two identifiers.   I discussed the limitations of evaluation and management by telemedicine and the availability of in person appointments. The patient expressed understanding and agreed to proceed. Provider in office/ patient is at home; provider and patient are only 2 people on video call.   + COVID on 4/26; notes he has been feeling better in past 24 hours except for sore throat; still has low grade fever and cough; denies any chest pain or shortness of breath; requesting treatment for  sore throat;    Objective:  Vitals:   03/03/21 1449  Weight: 205 lb (93 kg)  Height: 6\' 2"  (1.88 m)    General: Well developed, well nourished, in no acute distress  Skin : Warm and dry.  Head: Normocephalic and atraumatic  Lungs: Respirations unlabored;  Neurologic: Alert and oriented; speech intact; face symmetrical;   Assessment:  1. COVID-19     Plan:  Discussed outpatient treatment options- specifically Paxlovid; he notes he is in [place] and does not want to drive to speciality pharmacy through [facility] to get this  medication; he understands this medication needs to be started within the 5 day window to be effective; will send this Rx to local pharmacy and he will discuss with pharmacy to see if he can get it; Will also send in Rx for Magic Mouthwash to use for symptom relief; increase fluids, rest and follow up with his PCP if symptoms persist.   No follow-ups on file.  No orders of the defined types were placed in this encounter.   Requested Prescriptions   Signed Prescriptions Disp Refills  . magic mouthwash SOLN 150 mL 0    Sig: Take 5 mLs by mouth 4 (four) times daily as needed for mouth pain (swish/ swallow). Mix equal parts Maalox Extra Strength, Nystatin and Diphenhydramine  . nirmatrelvir/ritonavir EUA (PAXLOVID) TABS 30 tablet 0    Sig: Take 3 tablets by mouth 2 (two) times daily for 5 days. Patient GFR is 94. Take nirmatrelvir (150 mg) 2 tablet(s) twice daily for 5 days and ritonavir (100 mg) one tablet twice daily for 5 days.

## 2021-03-06 NOTE — Progress Notes (Signed)
Per protocol for COVID related Evisit, proof of positive test required.  Pt did not reply to request on Evisit but appears he completed a same day, 03/03/21, video visit.  NO Charge.

## 2021-08-24 ENCOUNTER — Encounter: Payer: 59 | Admitting: Internal Medicine

## 2021-09-04 ENCOUNTER — Encounter: Payer: Self-pay | Admitting: Internal Medicine

## 2021-09-04 ENCOUNTER — Other Ambulatory Visit: Payer: Self-pay

## 2021-09-04 ENCOUNTER — Ambulatory Visit (INDEPENDENT_AMBULATORY_CARE_PROVIDER_SITE_OTHER): Payer: 59 | Admitting: Internal Medicine

## 2021-09-04 VITALS — BP 120/70 | HR 57 | Temp 97.9°F | Ht 75.0 in | Wt 210.6 lb

## 2021-09-04 DIAGNOSIS — E785 Hyperlipidemia, unspecified: Secondary | ICD-10-CM

## 2021-09-04 DIAGNOSIS — Z0000 Encounter for general adult medical examination without abnormal findings: Secondary | ICD-10-CM

## 2021-09-04 LAB — CBC WITH DIFFERENTIAL/PLATELET
Basophils Absolute: 0 10*3/uL (ref 0.0–0.1)
Basophils Relative: 0.7 % (ref 0.0–3.0)
Eosinophils Absolute: 0.2 10*3/uL (ref 0.0–0.7)
Eosinophils Relative: 3.1 % (ref 0.0–5.0)
HCT: 39.5 % (ref 39.0–52.0)
Hemoglobin: 13.4 g/dL (ref 13.0–17.0)
Lymphocytes Relative: 26.9 % (ref 12.0–46.0)
Lymphs Abs: 1.5 10*3/uL (ref 0.7–4.0)
MCHC: 33.9 g/dL (ref 30.0–36.0)
MCV: 91 fl (ref 78.0–100.0)
Monocytes Absolute: 0.5 10*3/uL (ref 0.1–1.0)
Monocytes Relative: 8 % (ref 3.0–12.0)
Neutro Abs: 3.5 10*3/uL (ref 1.4–7.7)
Neutrophils Relative %: 61.3 % (ref 43.0–77.0)
Platelets: 248 10*3/uL (ref 150.0–400.0)
RBC: 4.34 Mil/uL (ref 4.22–5.81)
RDW: 12.9 % (ref 11.5–15.5)
WBC: 5.7 10*3/uL (ref 4.0–10.5)

## 2021-09-04 LAB — URINALYSIS
Bilirubin Urine: NEGATIVE
Hgb urine dipstick: NEGATIVE
Ketones, ur: NEGATIVE
Leukocytes,Ua: NEGATIVE
Nitrite: NEGATIVE
Specific Gravity, Urine: 1.015 (ref 1.000–1.030)
Total Protein, Urine: NEGATIVE
Urine Glucose: NEGATIVE
Urobilinogen, UA: 0.2 (ref 0.0–1.0)
pH: 6 (ref 5.0–8.0)

## 2021-09-04 LAB — COMPREHENSIVE METABOLIC PANEL
ALT: 17 U/L (ref 0–53)
AST: 19 U/L (ref 0–37)
Albumin: 4.5 g/dL (ref 3.5–5.2)
Alkaline Phosphatase: 55 U/L (ref 39–117)
BUN: 13 mg/dL (ref 6–23)
CO2: 30 mEq/L (ref 19–32)
Calcium: 9.7 mg/dL (ref 8.4–10.5)
Chloride: 103 mEq/L (ref 96–112)
Creatinine, Ser: 0.82 mg/dL (ref 0.40–1.50)
GFR: 93.4 mL/min (ref >60.00)
Glucose, Bld: 92 mg/dL (ref 70–99)
Potassium: 4.4 mEq/L (ref 3.5–5.1)
Sodium: 140 mEq/L (ref 135–145)
Total Bilirubin: 0.7 mg/dL (ref 0.2–1.2)
Total Protein: 6.7 g/dL (ref 6.0–8.3)

## 2021-09-04 LAB — LIPID PANEL
Cholesterol: 217 mg/dL — ABNORMAL HIGH (ref 0–200)
HDL: 52.8 mg/dL (ref >39.00)
LDL Cholesterol: 148 mg/dL — ABNORMAL HIGH (ref 0–99)
NonHDL: 164.38
Total CHOL/HDL Ratio: 4
Triglycerides: 83 mg/dL (ref 0.0–149.0)
VLDL: 16.6 mg/dL (ref 0.0–40.0)

## 2021-09-04 LAB — PSA: PSA: 0.57 ng/mL (ref 0.10–4.00)

## 2021-09-04 LAB — TSH: TSH: 2.29 u[IU]/mL (ref 0.35–5.50)

## 2021-09-04 NOTE — Assessment & Plan Note (Signed)
Mild. Declined statins  CT scan for calcium scoring ordered again

## 2021-09-04 NOTE — Assessment & Plan Note (Addendum)
We discussed age appropriate health related issues, including available/recomended screening tests and vaccinations. We discussed a need for adhering to healthy diet and exercise. Labs/EKG were reviewed/ordered. All questions were answered. Form filled out.  CT scan for calcium scoring ordered  Colon due in 2025  tDAP 2020 flu shot declined Shingrix declined 

## 2021-09-04 NOTE — Progress Notes (Signed)
Subjective:  Patient ID: CHIMA ASTORINO, male    DOB: [birth year]  Age: 65 y.o. MRN: [id number]  CC: Annual Exam   HPI LANGDON CROSSON presents for a well exam  Outpatient Medications Prior to Visit  Medication Sig Dispense Refill   Cholecalciferol (VITAMIN D3) 2000 units capsule Take 1 capsule (2,000 Units total) by mouth daily. (Patient not taking: Reported on 03/03/2021) 100 capsule 3   magic mouthwash SOLN Take 5 mLs by mouth 4 (four) times daily as needed for mouth pain (swish/ swallow). Mix equal parts Maalox Extra Strength, Nystatin and Diphenhydramine 150 mL 0   vitamin E (VITAMIN E) 180 MG (400 UNITS) capsule Take 1 capsule (400 Units total) by mouth daily. (Patient not taking: Reported on 03/03/2021) 100 capsule 3   No facility-administered medications prior to visit.    ROS: Review of Systems  Constitutional:  Negative for appetite change, fatigue and unexpected weight change.  HENT:  Negative for congestion, nosebleeds, sneezing, sore throat and trouble swallowing.   Eyes:  Negative for itching and visual disturbance.  Respiratory:  Negative for cough.   Cardiovascular:  Negative for chest pain, palpitations and leg swelling.  Gastrointestinal:  Negative for abdominal distention, blood in stool, diarrhea and nausea.  Genitourinary:  Negative for frequency and hematuria.  Musculoskeletal:  Positive for arthralgias. Negative for back pain, gait problem, joint swelling and neck pain.  Skin:  Negative for rash.  Neurological:  Negative for dizziness, tremors, speech difficulty and weakness.  Psychiatric/Behavioral:  Negative for agitation, dysphoric mood and sleep disturbance. The patient is not nervous/anxious.    Objective:  BP 120/70 (BP Location: Left Arm)   Pulse (!) 57   Temp 97.9 F (36.6 C) (Oral)   Ht 6\' 3"  (1.905 m)   Wt 210 lb 9.6 oz (95.5 kg)   SpO2 97%   BMI 26.32 kg/m   BP Readings from Last 3 Encounters:  09/04/21 120/70  09/20/20 128/70  06/06/20  (!) 130/70    Wt Readings from Last 3 Encounters:  09/04/21 210 lb 9.6 oz (95.5 kg)  03/03/21 205 lb (93 kg)  09/20/20 207 lb 12.8 oz (94.3 kg)    Physical Exam Constitutional:      General: He is not in acute distress.    Appearance: He is well-developed.     Comments: NAD  Eyes:     Conjunctiva/sclera: Conjunctivae normal.     Pupils: Pupils are equal, round, and reactive to light.  Neck:     Thyroid: No thyromegaly.     Vascular: No JVD.  Cardiovascular:     Rate and Rhythm: Normal rate and regular rhythm.     Heart sounds: Normal heart sounds. No murmur heard.   No friction rub. No gallop.  Pulmonary:     Effort: Pulmonary effort is normal. No respiratory distress.     Breath sounds: Normal breath sounds. No wheezing or rales.  Chest:     Chest wall: No tenderness.  Abdominal:     General: Bowel sounds are normal. There is no distension.     Palpations: Abdomen is soft. There is no mass.     Tenderness: There is no abdominal tenderness. There is no guarding or rebound.  Genitourinary:    Prostate: Normal.     Rectum: Normal. Guaiac result negative.  Musculoskeletal:        General: No tenderness. Normal range of motion.     Cervical back: Normal range of motion.  Lymphadenopathy:  Cervical: No cervical adenopathy.  Skin:    General: Skin is warm and dry.     Findings: No rash.  Neurological:     Mental Status: He is alert and oriented to person, place, and time.     Cranial Nerves: No cranial nerve deficit.     Motor: No abnormal muscle tone.     Coordination: Coordination normal.     Gait: Gait normal.     Deep Tendon Reflexes: Reflexes are normal and symmetric.  Psychiatric:        Behavior: Behavior normal.        Thought Content: Thought content normal.        Judgment: Judgment normal.    Lab Results  Component Value Date   WBC 5.2 09/23/2020   HGB 13.2 09/23/2020   HCT 39.4 09/23/2020   PLT 232.0 09/23/2020   GLUCOSE 89 09/23/2020   CHOL  189 09/23/2020   TRIG 40.0 09/23/2020   HDL 53.10 09/23/2020   LDLDIRECT 155.3 05/05/2013   LDLCALC 128 (H) 09/23/2020   ALT 21 09/23/2020   AST 23 09/23/2020   NA 140 09/23/2020   K 4.4 09/23/2020   CL 105 09/23/2020   CREATININE 0.73 09/23/2020   BUN 11 09/23/2020   CO2 28 09/23/2020   TSH 1.41 09/23/2020   PSA 0.34 09/23/2020    DG Elbow Complete Left  Result Date: 08/21/2017 CLINICAL DATA:  Left elbow pain for 3 months. No known injury. Initial encounter. EXAM: LEFT ELBOW - COMPLETE 3+ VIEW COMPARISON:  None. FINDINGS: There is no evidence of fracture, dislocation, or joint effusion. There is no evidence of arthropathy or other focal bone abnormality. Mild spurring at the triceps tendon insertion on the olecranon is noted. Soft tissues are unremarkable. IMPRESSION: Mild spurring at the triceps tendon insertion on the olecranon. The study is otherwise negative. Electronically Signed   By: Inge Rise M.D.   On: 08/21/2017 16:41   [place] LIMITED JOINT SPACE STRUCTURES LOW LEFT  Result Date: 09/04/2017 Musculoskeletal ultrasound was performed and interpreted by Charlann Boxer D.O.   Elbow: Lateral epicondyle and common extensor tendon origin visualized. There seems to be healing but still some hypoechoic changes within the surrounding area. No avulsion fracture seen previously as well healed at this time. Significant increase in Doppler flow noted. Trace effusion of the joint   Interval healing of the lateral epicondylar region   Assessment & Plan:   Problem List Items Addressed This Visit     Dyslipidemia - Primary    Mild. Declined statins  CT scan for calcium scoring ordered again      Relevant Orders   CT CARDIAC SCORING (SELF PAY ONLY)   Well adult exam    We discussed age appropriate health related issues, including available/recomended screening tests and vaccinations. We discussed a need for adhering to healthy diet and exercise. Labs/EKG were reviewed/ordered. All  questions were answered. Form filled out.  CT scan for calcium scoring ordered  Colon due in 2025  tDAP 2020 flu shot declined Shingrix declined      Relevant Orders   TSH   Urinalysis   CBC with Differential/Platelet   Lipid panel   PSA   Comprehensive metabolic panel      No orders of the defined types were placed in this encounter.     Follow-up: Return in about 1 year (around 09/04/2022) for Wellness Exam.  Walker Kehr, MD

## 2021-09-04 NOTE — Patient Instructions (Signed)

## 2021-10-17 ENCOUNTER — Inpatient Hospital Stay: Admission: RE | Admit: 2021-10-17 | Payer: 59 | Source: Ambulatory Visit

## 2021-10-18 ENCOUNTER — Other Ambulatory Visit: Payer: Self-pay | Admitting: Internal Medicine

## 2021-10-18 ENCOUNTER — Other Ambulatory Visit: Payer: Self-pay

## 2021-10-18 ENCOUNTER — Ambulatory Visit (INDEPENDENT_AMBULATORY_CARE_PROVIDER_SITE_OTHER)
Admission: RE | Admit: 2021-10-18 | Discharge: 2021-10-18 | Disposition: A | Discharge status: Home / Self Care | Payer: Self-pay | Source: Ambulatory Visit | Attending: Internal Medicine | Admitting: Internal Medicine

## 2021-10-18 DIAGNOSIS — I712 Thoracic aortic aneurysm, without rupture, unspecified: Secondary | ICD-10-CM | POA: Insufficient documentation

## 2021-10-18 DIAGNOSIS — E785 Hyperlipidemia, unspecified: Secondary | ICD-10-CM

## 2021-10-18 DIAGNOSIS — I2510 Atherosclerotic heart disease of native coronary artery without angina pectoris: Secondary | ICD-10-CM | POA: Insufficient documentation

## 2021-10-18 DIAGNOSIS — I2583 Coronary atherosclerosis due to lipid rich plaque: Secondary | ICD-10-CM

## 2021-10-18 DIAGNOSIS — I7121 Aneurysm of the ascending aorta, without rupture: Secondary | ICD-10-CM

## 2021-10-18 MED ORDER — FISH OIL 1000 MG PO CAPS: 2.0000 | ORAL_CAPSULE | Freq: Every day | ORAL | 3 refills | Status: AC

## 2022-01-29 ENCOUNTER — Encounter: Payer: Self-pay | Admitting: Internal Medicine

## 2022-01-29 ENCOUNTER — Ambulatory Visit (INDEPENDENT_AMBULATORY_CARE_PROVIDER_SITE_OTHER): Payer: 59

## 2022-01-29 ENCOUNTER — Other Ambulatory Visit: Payer: Self-pay

## 2022-01-29 ENCOUNTER — Ambulatory Visit (INDEPENDENT_AMBULATORY_CARE_PROVIDER_SITE_OTHER): Payer: 59 | Admitting: Internal Medicine

## 2022-01-29 DIAGNOSIS — L989 Disorder of the skin and subcutaneous tissue, unspecified: Secondary | ICD-10-CM | POA: Diagnosis not present

## 2022-01-29 DIAGNOSIS — G8929 Other chronic pain: Secondary | ICD-10-CM | POA: Diagnosis not present

## 2022-01-29 DIAGNOSIS — M25552 Pain in left hip: Secondary | ICD-10-CM

## 2022-01-29 NOTE — Assessment & Plan Note (Addendum)
Chronic ?L hip and groin pain x 1 year - getting worse - short stabbing pains once a week; popping when walking ?Playing tennis - singles ?X ray hip ?Options discussed, ie Sport Med f/u ?Try Blue Emu cream ?

## 2022-01-29 NOTE — Patient Instructions (Addendum)
Try Blue Emu cream ? ?For a mild COVID-19 case - take zinc 50 mg a day for 1 week, vitamin C 1000 mg daily for 1 week, vitamin D2 50,000 units weekly for 2 months (unless  taking vitamin D daily already), an antioxidant Quercetin 500 mg twice a day for 1 week (if you can get it quick enough). Take Allegra or Benadryl.  Maintain good oral hydration and take Tylenol for high fever.  ?Call if problems. Isolate for 5 days, then wear a mask for 5 days per CDC. ? ?

## 2022-01-29 NOTE — Assessment & Plan Note (Addendum)
New - skin bx ?L chest  ?

## 2022-01-29 NOTE — Progress Notes (Signed)
? ?Subjective:  ?Patient ID: Wayne [name], male    DOB: [birth year]  Age: 66 y.o. MRN: [id number] ? ?CC: No chief complaint on file. ? ? ?HPI ?Wayne [name] presents for L hip and groin pain x 1 year - getting worse - short stabbing pains once a week; popping when walking ?Playing tennis - singles ?C/o skin lesion ? ?Outpatient Medications Prior to Visit  ?Medication Sig Dispense Refill  ? Omega-3 Fatty Acids (FISH OIL) 1000 MG CAPS Take 2 capsules (2,000 mg total) by mouth daily. 100 capsule 3  ? ?No facility-administered medications prior to visit.  ? ? ?ROS: ?Review of Systems  ?Constitutional:  Negative for appetite change, fatigue and unexpected weight change.  ?HENT:  Negative for congestion, nosebleeds, sneezing, sore throat and trouble swallowing.   ?Eyes:  Negative for itching and visual disturbance.  ?Respiratory:  Negative for cough.   ?Cardiovascular:  Negative for chest pain, palpitations and leg swelling.  ?Gastrointestinal:  Negative for abdominal distention, blood in stool, diarrhea and nausea.  ?Genitourinary:  Negative for frequency and hematuria.  ?Musculoskeletal:  Positive for arthralgias. Negative for back pain, gait problem, joint swelling and neck pain.  ?Skin:  Negative for rash.  ?Neurological:  Negative for dizziness, tremors, speech difficulty and weakness.  ?Psychiatric/Behavioral:  Negative for agitation, dysphoric mood and sleep disturbance. The patient is not nervous/anxious.   ? ?Objective:  ?BP 140/78 (BP Location: Left Arm, Patient Position: Sitting, Cuff Size: Large)   Pulse 78   Temp 98.1 ?F (36.7 ?C) (Oral)   Ht '6\' 3"'$  (1.905 m)   Wt 221 lb (100.2 kg)   SpO2 98%   BMI 27.62 kg/m?  ? ?BP Readings from Last 3 Encounters:  ?01/29/22 140/78  ?09/04/21 120/70  ?09/20/20 128/70  ? ? ?Wt Readings from Last 3 Encounters:  ?01/29/22 221 lb (100.2 kg)  ?09/04/21 210 lb 9.6 oz (95.5 kg)  ?03/03/21 205 lb (93 kg)  ? ? ?Physical Exam ?Constitutional:   ?   General: He is not in acute  distress. ?   Appearance: He is well-developed.  ?   Comments: NAD  ?Eyes:  ?   Conjunctiva/sclera: Conjunctivae normal.  ?   Pupils: Pupils are equal, round, and reactive to light.  ?Neck:  ?   Thyroid: No thyromegaly.  ?   Vascular: No JVD.  ?Cardiovascular:  ?   Rate and Rhythm: Normal rate and regular rhythm.  ?   Heart sounds: Normal heart sounds. No murmur heard. ?  No friction rub. No gallop.  ?Pulmonary:  ?   Effort: Pulmonary effort is normal. No respiratory distress.  ?   Breath sounds: Normal breath sounds. No wheezing or rales.  ?Chest:  ?   Chest wall: No tenderness.  ?Abdominal:  ?   General: Bowel sounds are normal. There is no distension.  ?   Palpations: Abdomen is soft. There is no mass.  ?   Tenderness: There is no abdominal tenderness. There is no guarding or rebound.  ?Musculoskeletal:     ?   General: No tenderness. Normal range of motion.  ?   Cervical back: Normal range of motion.  ?Lymphadenopathy:  ?   Cervical: No cervical adenopathy.  ?Skin: ?   General: Skin is warm and dry.  ?   Findings: No rash.  ?Neurological:  ?   Mental Status: He is alert and oriented to person, place, and time.  ?   Cranial Nerves: No cranial nerve deficit.  ?  Motor: No abnormal muscle tone.  ?   Coordination: Coordination normal.  ?   Gait: Gait normal.  ?   Deep Tendon Reflexes: Reflexes are normal and symmetric.  ?Psychiatric:     ?   Behavior: Behavior normal.     ?   Thought Content: Thought content normal.     ?   Judgment: Judgment normal.  ?Mole L ches ?B hips w/good ROM ? ?Lab Results  ?Component Value Date  ? WBC 5.7 09/04/2021  ? HGB 13.4 09/04/2021  ? HCT 39.5 09/04/2021  ? PLT 248.0 09/04/2021  ? GLUCOSE 92 09/04/2021  ? CHOL 217 (H) 09/04/2021  ? TRIG 83.0 09/04/2021  ? HDL 52.80 09/04/2021  ? LDLDIRECT 155.3 05/05/2013  ? LDLCALC 148 (H) 09/04/2021  ? ALT 17 09/04/2021  ? AST 19 09/04/2021  ? NA 140 09/04/2021  ? K 4.4 09/04/2021  ? CL 103 09/04/2021  ? CREATININE 0.82 09/04/2021  ? BUN 13  09/04/2021  ? CO2 30 09/04/2021  ? TSH 2.29 09/04/2021  ? PSA 0.57 09/04/2021  ? ? ?CT CARDIAC SCORING (SELF PAY ONLY) ? ?Addendum Date: 10/18/2021   ?ADDENDUM REPORT: 10/18/2021 17:50 CLINICAL DATA:  Cardiovascular Disease Risk stratification EXAM: Coronary Calcium Score TECHNIQUE: A gated, non-contrast computed tomography scan of the heart was performed using 28m slice thickness. Axial images were analyzed on a dedicated workstation. Calcium scoring of the coronary arteries was performed using the Agatston method. FINDINGS: Coronary arteries: Normal origins. Coronary Calcium Score: Left main: 0 Left anterior descending artery: 0 Left circumflex artery: 0 Right coronary artery: 3.05 Total: 3.05 Percentile: 27th Pericardium: Normal. Ascending Aorta: Aneurysmal aorta to 40 mm at the level of the main PA bifurcation. [facility]-cardiac: [facility] IMPRESSION: 1. Coronary calcium score of 3.05. This was 27th percentile for age-, race-, and sex-matched controls. 2. Ascending Aorta: Aneurysmal aorta to 40 mm at the level of the main PA bifurcation. 3. Aortic valve calcifications. RECOMMENDATIONS: Coronary artery calcium (CAC) score is a strong predictor of incident coronary heart disease (CHD) and provides predictive information beyond traditional risk factors. CAC scoring is reasonable to use in the decision to withhold, postpone, or initiate statin therapy in intermediate-risk or selected borderline-risk asymptomatic adults (age 64-75years and LDL-C >=70 to <190 mg/dL) who do not have diabetes or established atherosclerotic cardiovascular disease (ASCVD).* In intermediate-risk (10-year ASCVD risk >=7.5% to <20%) adults or selected borderline-risk (10-year ASCVD risk >=5% to <7.5%) adults in whom a CAC score is measured for the purpose of making a treatment decision the following recommendations have been made: If CAC=0, it is reasonable to withhold statin therapy and reassess in 5 to 10  years, as long as higher risk conditions are absent (diabetes mellitus, family history of premature CHD in first degree relatives (males <55 years; females <65 years), cigarette smoking, or LDL >=190 mg/dL). If CAC is 1 to 99, it is reasonable to initiate statin therapy for patients >=523years of age. If CAC is >=100 or >=75th percentile, it is reasonable to initiate statin therapy at any age. Cardiology referral should be considered for patients with CAC scores >=400 or >=75th percentile. *2018 AHA/ACC/AACVPR/AAPA/ABC/ACPM/ADA/AGS/APhA/ASPC/NLA/PCNA Guideline on the Management of Blood Cholesterol: A Report of the American College of Cardiology/American Heart Association Task Force on Clinical Practice Guidelines. J Am Coll Cardiol. 2019;73(24):3168-3209. KLyman Bishop MD Electronically Signed   By: KPixie CasinoM.D.   On: 10/18/2021 17:50  ? ?Result Date: 10/18/2021 ?EXAM: OVER-READ INTERPRETATION  CT  CHEST The following report is an over-read performed by radiologist Dr. Rolm Baptise of [facility], [place] on 10/18/2021. This over-read does not include interpretation of cardiac or coronary anatomy or pathology. The dyslipidemia interpretation by the cardiologist is attached. COMPARISON:  None. FINDINGS: Vascular: Mild aneurysmal dilatation of the ascending thoracic aorta, 4.1 cm. Heart is normal size. Mediastinum/Nodes: No adenopathy Lungs/Pleura: Lungs clear.  No effusions. Upper Abdomen: Imaging into the upper abdomen demonstrates no acute findings. Musculoskeletal: Chest wall soft tissues are unremarkable. No acute bony abnormality. IMPRESSION: 4.1 cm ascending thoracic aortic aneurysm. Recommend annual imaging followup by CTA or MRA. This recommendation follows 2010 ACCF/AHA/AATS/ACR/ASA/SCA/SCAI/SIR/STS/SVM Guidelines for the Diagnosis and Management of Patients with Thoracic Aortic Disease. Circulation. 2010; 121: W446-K863. Aortic aneurysm NOS (ICD10-I71.9) Electronically Signed: By: Rolm Baptise  M.D. On: 10/18/2021 15:38  ? ? ?Assessment & Plan:  ? ?Problem List Items Addressed This Visit   ? ? Hip pain, chronic, left  ?  Chronic ?L hip and groin pain x 1 year - getting worse - short stabbing pains once a we

## 2022-02-18 ENCOUNTER — Encounter: Payer: Self-pay | Admitting: Internal Medicine

## 2022-02-19 ENCOUNTER — Other Ambulatory Visit: Payer: Self-pay | Admitting: Internal Medicine

## 2022-02-19 DIAGNOSIS — M79605 Pain in left leg: Secondary | ICD-10-CM

## 2022-03-01 ENCOUNTER — Telehealth: Payer: 59 | Admitting: Family Medicine

## 2022-03-16 DIAGNOSIS — M1612 Unilateral primary osteoarthritis, left hip: Secondary | ICD-10-CM | POA: Insufficient documentation

## 2022-05-27 IMAGING — DX DG HIP (WITH OR WITHOUT PELVIS) 2-3V*R*
3 series · 3 of 3 positions shown · non-contrast
Comparison: None.

CLINICAL DATA: Right hip pain for 2 months.

EXAM:
DG HIP (WITH OR WITHOUT PELVIS) 2-3V RIGHT

[pelvis ap]
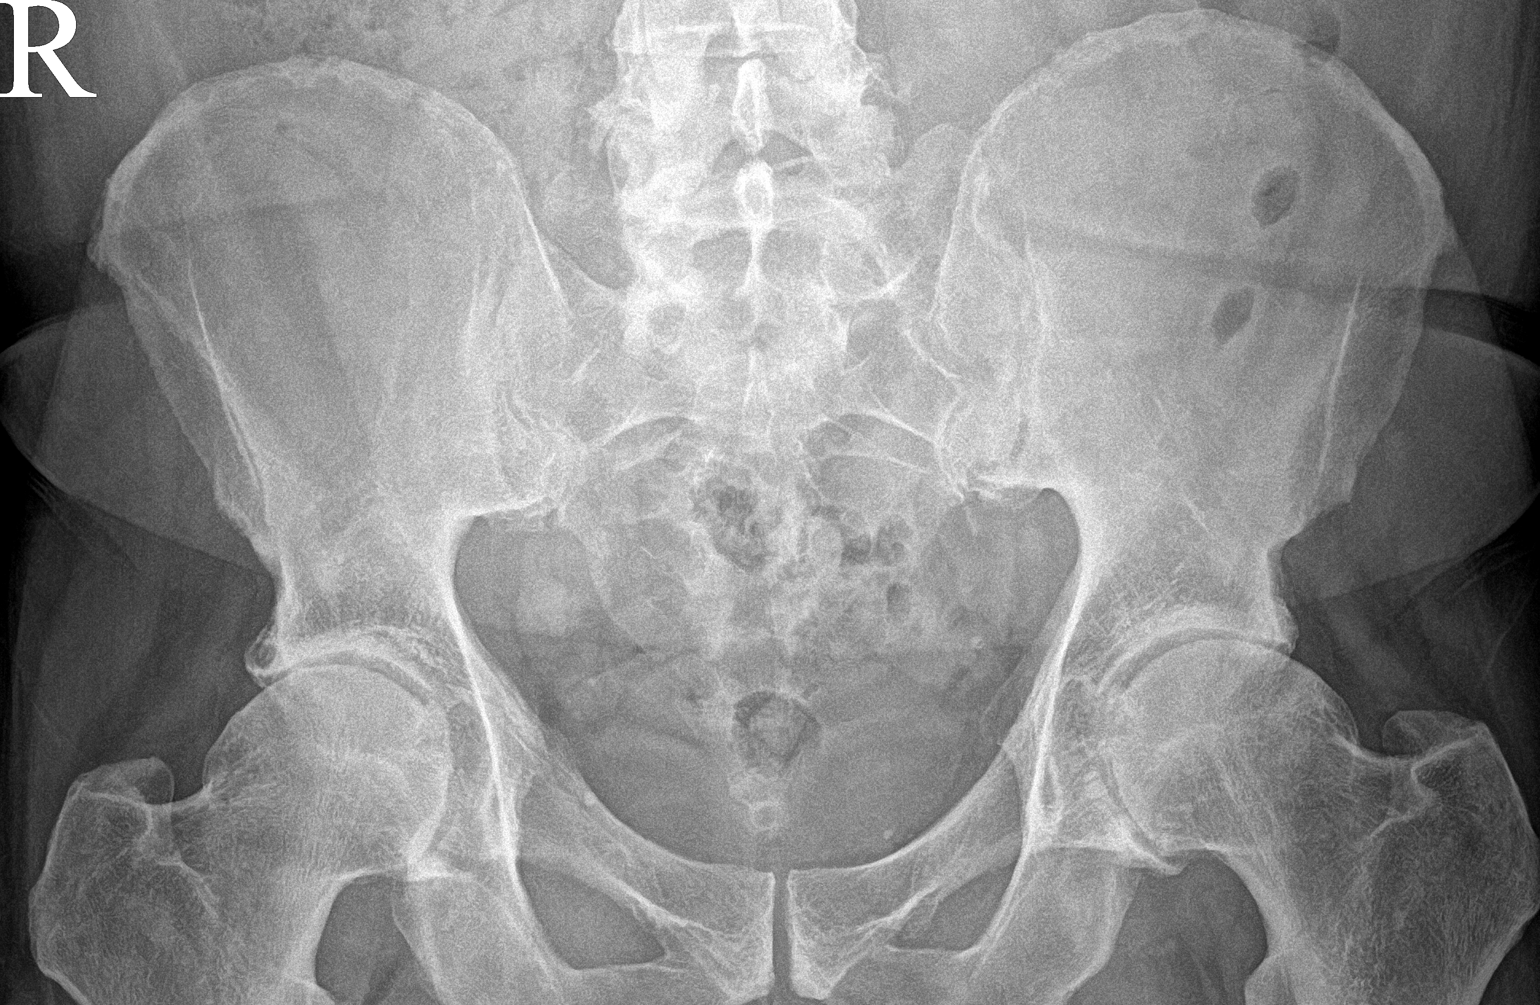

[hip ap]
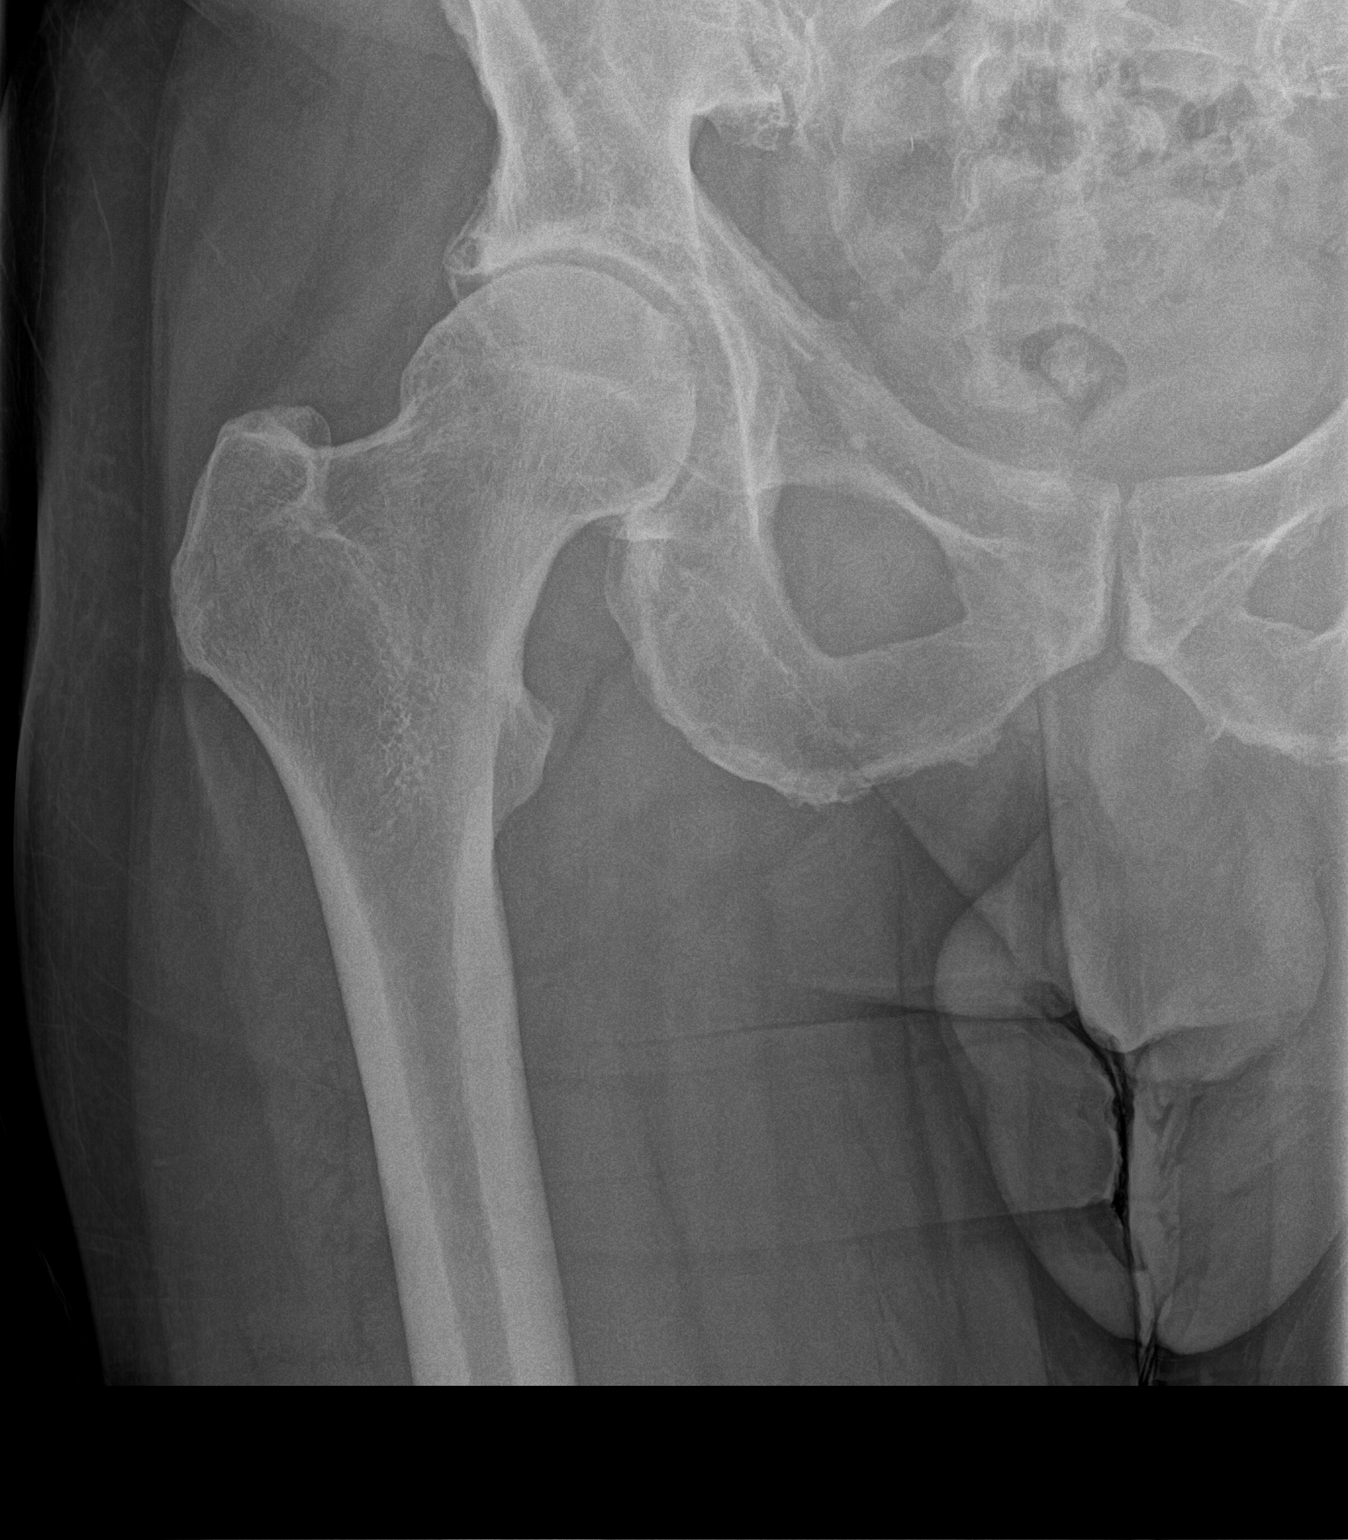

[hip frog leg]
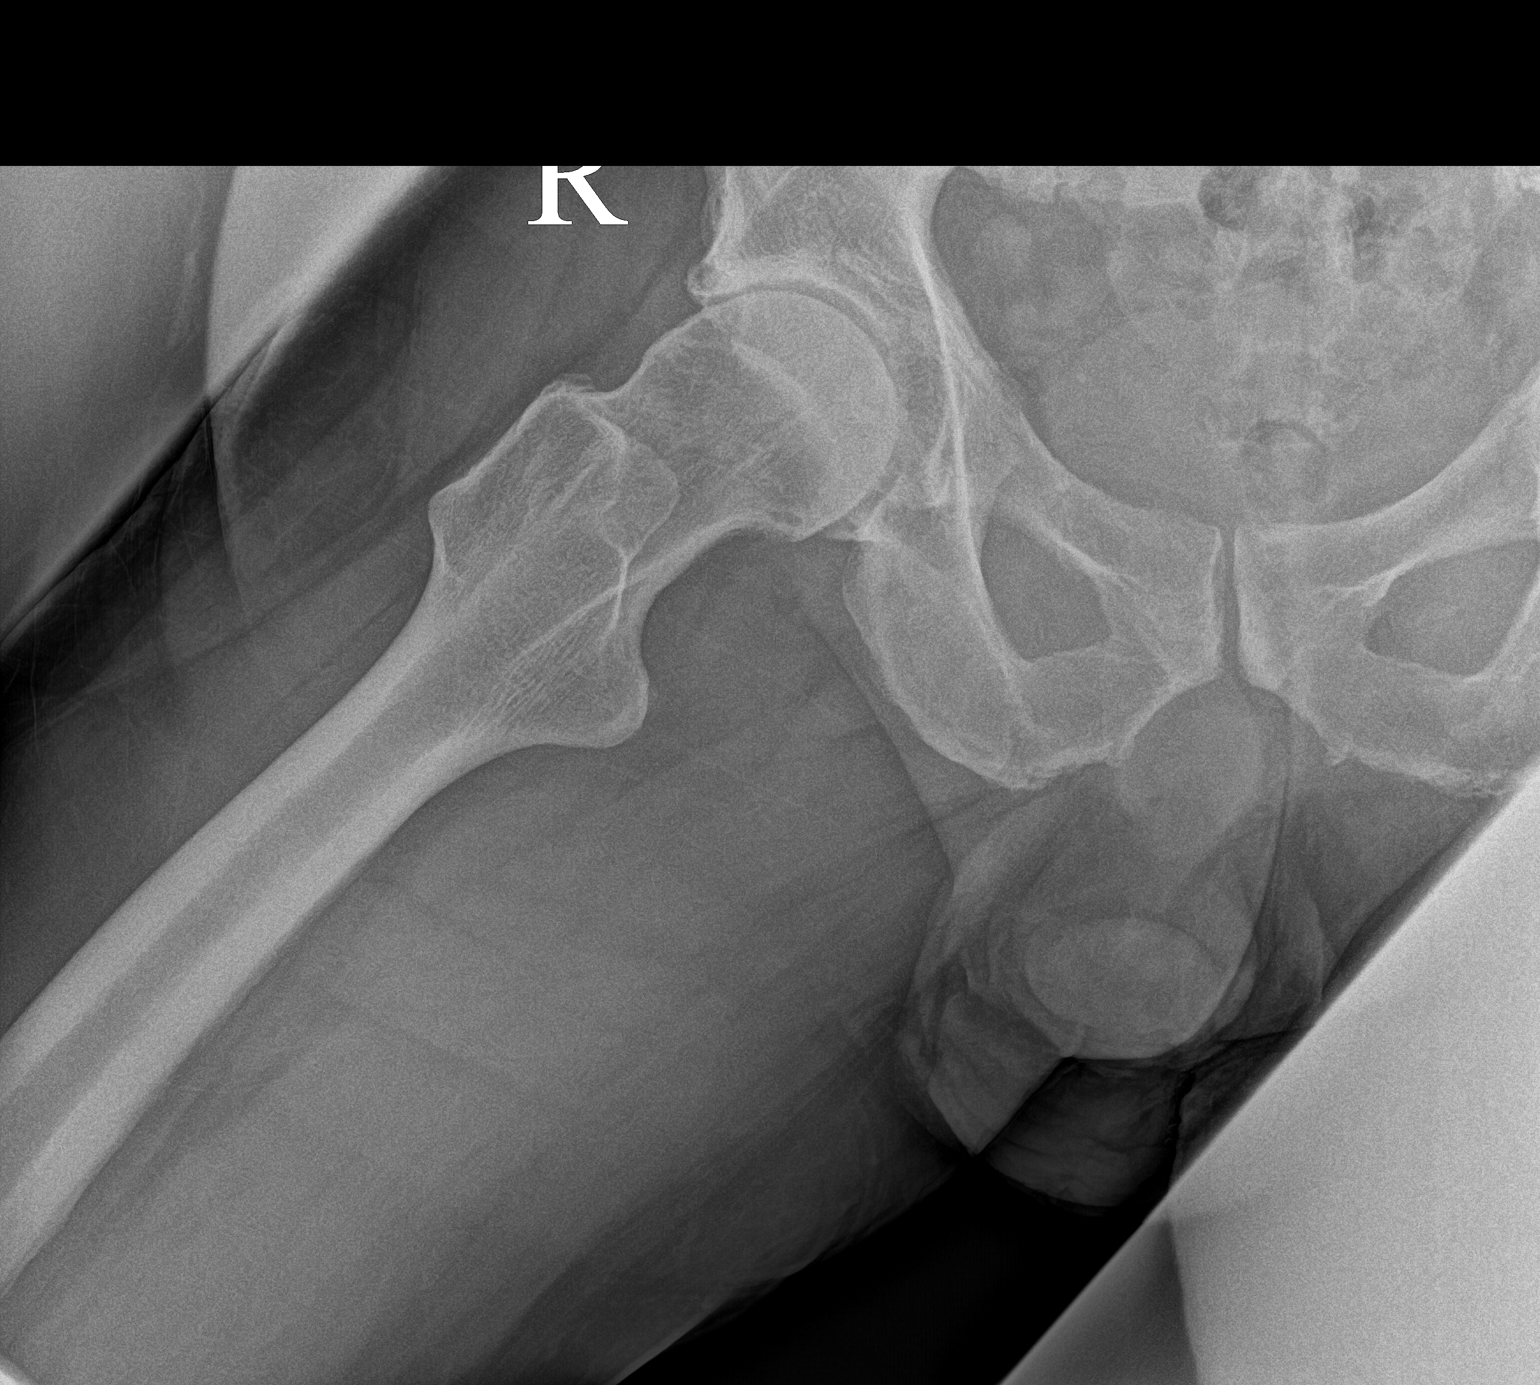

[3 of 3 positions shown; findings below may reference images not displayed]

FINDINGS: There is no evidence of hip fracture or dislocation. Mild narrowing
of the right hip joint is noted.
IMPRESSION: Mild degenerative joint disease of the right hip. No acute
abnormality seen.

## 2022-10-03 ENCOUNTER — Encounter: Payer: Self-pay | Admitting: Internal Medicine

## 2022-10-03 ENCOUNTER — Ambulatory Visit (INDEPENDENT_AMBULATORY_CARE_PROVIDER_SITE_OTHER): Payer: 59 | Admitting: Internal Medicine

## 2022-10-03 VITALS — BP 118/62 | HR 63 | Temp 97.8°F | Ht 75.0 in | Wt 226.0 lb

## 2022-10-03 DIAGNOSIS — Z0001 Encounter for general adult medical examination with abnormal findings: Secondary | ICD-10-CM | POA: Diagnosis not present

## 2022-10-03 DIAGNOSIS — Z0000 Encounter for general adult medical examination without abnormal findings: Secondary | ICD-10-CM | POA: Diagnosis not present

## 2022-10-03 DIAGNOSIS — L219 Seborrheic dermatitis, unspecified: Secondary | ICD-10-CM | POA: Diagnosis not present

## 2022-10-03 DIAGNOSIS — L989 Disorder of the skin and subcutaneous tissue, unspecified: Secondary | ICD-10-CM

## 2022-10-03 MED ORDER — CLOTRIMAZOLE-BETAMETHASONE 1-0.05 % EX CREA: 1.0000 | TOPICAL_CREAM | Freq: Every day | CUTANEOUS | 2 refills | Status: AC

## 2022-10-03 MED ORDER — MELOXICAM 15 MG PO TABS: 15.0000 mg | ORAL_TABLET | Freq: Every day | ORAL | 0 refills | Status: DC | PRN

## 2022-10-03 NOTE — Assessment & Plan Note (Signed)
Rx - Lotrisone cream bid prn

## 2022-10-03 NOTE — Progress Notes (Signed)
Subjective:  Patient ID: NGAI PARCELL, male    DOB: 1956/07/03  Age: 66 y.o. MRN: [id number]  CC: Annual Exam   HPI DECARLO RIVET presents for a well exam  S/p THR  Outpatient Medications Prior to Visit  Medication Sig Dispense Refill   Omega-3 Fatty Acids (FISH OIL) 1000 MG CAPS Take 2 capsules (2,000 mg total) by mouth daily. 100 capsule 3   No facility-administered medications prior to visit.    ROS: Review of Systems  Constitutional:  Negative for appetite change, fatigue and unexpected weight change.  HENT:  Negative for congestion, nosebleeds, sneezing, sore throat and trouble swallowing.   Eyes:  Negative for itching and visual disturbance.  Respiratory:  Negative for cough.   Cardiovascular:  Negative for chest pain, palpitations and leg swelling.  Gastrointestinal:  Negative for abdominal distention, blood in stool, diarrhea and nausea.  Genitourinary:  Negative for frequency and hematuria.  Musculoskeletal:  Positive for arthralgias and gait problem. Negative for back pain, joint swelling and neck pain.  Skin:  Negative for rash.  Neurological:  Negative for dizziness, tremors, speech difficulty and weakness.  Psychiatric/Behavioral:  Negative for agitation, dysphoric mood, sleep disturbance and suicidal ideas. The patient is not nervous/anxious.     Objective:  BP 118/62 (BP Location: Left Arm, Patient Position: Sitting, Cuff Size: Normal)   Pulse 63   Temp 97.8 F (36.6 C) (Oral)   Ht '6\' 3"'$  (1.905 m)   Wt 226 lb (102.5 kg)   SpO2 93%   BMI 28.25 kg/m   BP Readings from Last 3 Encounters:  10/03/22 118/62  01/29/22 140/78  09/04/21 120/70    Wt Readings from Last 3 Encounters:  10/03/22 226 lb (102.5 kg)  01/29/22 221 lb (100.2 kg)  09/04/21 210 lb 9.6 oz (95.5 kg)    Physical Exam Constitutional:      General: He is not in acute distress.    Appearance: Normal appearance. He is well-developed.     Comments: NAD  Eyes:      Conjunctiva/sclera: Conjunctivae normal.     Pupils: Pupils are equal, round, and reactive to light.  Neck:     Thyroid: No thyromegaly.     Vascular: No JVD.  Cardiovascular:     Rate and Rhythm: Normal rate and regular rhythm.     Heart sounds: Normal heart sounds. No murmur heard.    No friction rub. No gallop.  Pulmonary:     Effort: Pulmonary effort is normal. No respiratory distress.     Breath sounds: Normal breath sounds. No wheezing or rales.  Chest:     Chest wall: No tenderness.  Abdominal:     General: Bowel sounds are normal. There is no distension.     Palpations: Abdomen is soft. There is no mass.     Tenderness: There is no abdominal tenderness. There is no guarding or rebound.  Musculoskeletal:        General: No tenderness. Normal range of motion.     Cervical back: Normal range of motion.  Lymphadenopathy:     Cervical: No cervical adenopathy.  Skin:    General: Skin is warm and dry.     Findings: No rash.  Neurological:     Mental Status: He is alert and oriented to person, place, and time.     Cranial Nerves: No cranial nerve deficit.     Motor: No abnormal muscle tone.     Coordination: Coordination normal.  Gait: Gait normal.     Deep Tendon Reflexes: Reflexes are normal and symmetric.  Psychiatric:        Behavior: Behavior normal.        Thought Content: Thought content normal.        Judgment: Judgment normal.   Pt declined rectal exam  Lab Results  Component Value Date   WBC 5.7 09/04/2021   HGB 13.4 09/04/2021   HCT 39.5 09/04/2021   PLT 248.0 09/04/2021   GLUCOSE 92 09/04/2021   CHOL 217 (H) 09/04/2021   TRIG 83.0 09/04/2021   HDL 52.80 09/04/2021   LDLDIRECT 155.3 05/05/2013   LDLCALC 148 (H) 09/04/2021   ALT 17 09/04/2021   AST 19 09/04/2021   NA 140 09/04/2021   K 4.4 09/04/2021   CL 103 09/04/2021   CREATININE 0.82 09/04/2021   BUN 13 09/04/2021   CO2 30 09/04/2021   TSH 2.29 09/04/2021   PSA 0.57 09/04/2021    CT  CARDIAC SCORING (SELF PAY ONLY)  Addendum Date: 10/18/2021   ADDENDUM REPORT: 10/18/2021 17:50 CLINICAL DATA:  Cardiovascular Disease Risk stratification EXAM: Coronary Calcium Score TECHNIQUE: A gated, non-contrast computed tomography scan of the heart was performed using 58m slice thickness. Axial images were analyzed on a dedicated workstation. Calcium scoring of the coronary arteries was performed using the Agatston method. FINDINGS: Coronary arteries: Normal origins. Coronary Calcium Score: Left main: 0 Left anterior descending artery: 0 Left circumflex artery: 0 Right coronary artery: 3.05 Total: 3.05 Percentile: 27th Pericardium: Normal. Ascending Aorta: Aneurysmal aorta to 40 mm at the level of the main PA bifurcation. [facility]-cardiac: [facility]. IMPRESSION: 1. Coronary calcium score of 3.05. This was 27th percentile for age-, race-, and sex-matched controls. 2. Ascending Aorta: Aneurysmal aorta to 40 mm at the level of the main PA bifurcation. 3. Aortic valve calcifications. RECOMMENDATIONS: Coronary artery calcium (CAC) score is a strong predictor of incident coronary heart disease (CHD) and provides predictive information beyond traditional risk factors. CAC scoring is reasonable to use in the decision to withhold, postpone, or initiate statin therapy in intermediate-risk or selected borderline-risk asymptomatic adults (age 64-75years and LDL-C >=70 to <190 mg/dL) who do not have diabetes or established atherosclerotic cardiovascular disease (ASCVD).* In intermediate-risk (10-year ASCVD risk >=7.5% to <20%) adults or selected borderline-risk (10-year ASCVD risk >=5% to <7.5%) adults in whom a CAC score is measured for the purpose of making a treatment decision the following recommendations have been made: If CAC=0, it is reasonable to withhold statin therapy and reassess in 5 to 10 years, as long as higher risk conditions are absent (diabetes mellitus, family history of  premature CHD in first degree relatives (males <55 years; females <65 years), cigarette smoking, or LDL >=190 mg/dL). If CAC is 1 to 99, it is reasonable to initiate statin therapy for patients >=558years of age. If CAC is >=100 or >=75th percentile, it is reasonable to initiate statin therapy at any age. Cardiology referral should be considered for patients with CAC scores >=400 or >=75th percentile. *2018 AHA/ACC/AACVPR/AAPA/ABC/ACPM/ADA/AGS/APhA/ASPC/NLA/PCNA Guideline on the Management of Blood Cholesterol: A Report of the American College of Cardiology/American Heart Association Task Force on Clinical Practice Guidelines. J Am Coll Cardiol. 2019;73(24):3168-3209. KLyman Bishop MD Electronically Signed   By: KPixie CasinoM.D.   On: 10/18/2021 17:50   Result Date: 10/18/2021 EXAM: OVER-READ INTERPRETATION  CT CHEST The following report is an over-read performed by radiologist Dr. KRolm Baptiseof [facility] on  10/18/2021. This over-read does not include interpretation of cardiac or coronary anatomy or pathology. The dyslipidemia interpretation by the cardiologist is attached. COMPARISON:  None. FINDINGS: Vascular: Mild aneurysmal dilatation of the ascending thoracic aorta, 4.1 cm. Heart is normal size. Mediastinum/Nodes: No adenopathy Lungs/Pleura: Lungs clear.  No effusions. Upper Abdomen: Imaging into the upper abdomen demonstrates no acute findings. Musculoskeletal: Chest wall soft tissues are unremarkable. No acute bony abnormality. IMPRESSION: 4.1 cm ascending thoracic aortic aneurysm. Recommend annual imaging followup by CTA or MRA. This recommendation follows 2010 ACCF/AHA/AATS/ACR/ASA/SCA/SCAI/SIR/STS/SVM Guidelines for the Diagnosis and Management of Patients with Thoracic Aortic Disease. Circulation. 2010; 121: W295-A213. Aortic aneurysm NOS (ICD10-I71.9) Electronically Signed: By: Rolm Baptise M.D. On: 10/18/2021 15:38    Assessment & Plan:   Problem List Items Addressed This  Visit     Seborrheic dermatitis    Rx - Lotrisone cream bid prn      Skin lesion    Cryo suggested      Well adult exam - Primary    We discussed age appropriate health related issues, including available/recomended screening tests and vaccinations. We discussed a need for adhering to healthy diet and exercise. Labs/EKG were reviewed/ordered. All questions were answered. Form filled out.  CT scan for calcium scoring ordered  Colon due in 2025  tDAP 2020 flu shot declined Shingrix declined      Relevant Orders   TSH   Urinalysis   CBC with Differential/Platelet   Lipid panel   PSA   Comprehensive metabolic panel      Meds ordered this encounter  Medications   meloxicam (MOBIC) 15 MG tablet    Sig: Take 1 tablet (15 mg total) by mouth daily as needed for pain.    Dispense:  90 tablet    Refill:  0   clotrimazole-betamethasone (LOTRISONE) cream    Sig: Apply 1 Application topically daily.    Dispense:  45 g    Refill:  2      Follow-up: Return in about 1 year (around 10/04/2023) for a follow-up visit.  Walker Kehr, MD

## 2022-10-03 NOTE — Assessment & Plan Note (Signed)
Cryo suggested

## 2022-10-03 NOTE — Assessment & Plan Note (Signed)
We discussed age appropriate health related issues, including available/recomended screening tests and vaccinations. We discussed a need for adhering to healthy diet and exercise. Labs/EKG were reviewed/ordered. All questions were answered. Form filled out.  CT scan for calcium scoring ordered  Colon due in 2025  tDAP 2020 flu shot declined Shingrix declined

## 2022-10-09 LAB — LIPID PANEL
Cholesterol: 213 mg/dL — ABNORMAL HIGH (ref 0–200)
HDL: 51.2 mg/dL (ref >39.00)
LDL Cholesterol: 146 mg/dL — ABNORMAL HIGH (ref 0–99)
NonHDL: 162.21
Total CHOL/HDL Ratio: 4
Triglycerides: 80 mg/dL (ref 0.0–149.0)
VLDL: 16 mg/dL (ref 0.0–40.0)

## 2022-10-09 LAB — CBC WITH DIFFERENTIAL/PLATELET
Basophils Absolute: 0 10*3/uL (ref 0.0–0.1)
Basophils Relative: 0.7 % (ref 0.0–3.0)
Eosinophils Absolute: 0.3 10*3/uL (ref 0.0–0.7)
Eosinophils Relative: 4.6 % (ref 0.0–5.0)
HCT: 40.9 % (ref 39.0–52.0)
Hemoglobin: 13.7 g/dL (ref 13.0–17.0)
Lymphocytes Relative: 32 % (ref 12.0–46.0)
Lymphs Abs: 1.9 10*3/uL (ref 0.7–4.0)
MCHC: 33.6 g/dL (ref 30.0–36.0)
MCV: 87.4 fl (ref 78.0–100.0)
Monocytes Absolute: 0.6 10*3/uL (ref 0.1–1.0)
Monocytes Relative: 10.7 % (ref 3.0–12.0)
Neutro Abs: 3.1 10*3/uL (ref 1.4–7.7)
Neutrophils Relative %: 52 % (ref 43.0–77.0)
Platelets: 261 10*3/uL (ref 150.0–400.0)
RBC: 4.67 Mil/uL (ref 4.22–5.81)
RDW: 13.9 % (ref 11.5–15.5)
WBC: 5.9 10*3/uL (ref 4.0–10.5)

## 2022-10-09 LAB — URINALYSIS
Bilirubin Urine: NEGATIVE
Hgb urine dipstick: NEGATIVE
Ketones, ur: NEGATIVE
Leukocytes,Ua: NEGATIVE
Nitrite: NEGATIVE
Specific Gravity, Urine: 1.015 (ref 1.000–1.030)
Total Protein, Urine: NEGATIVE
Urine Glucose: NEGATIVE
Urobilinogen, UA: 0.2 (ref 0.0–1.0)
pH: 7 (ref 5.0–8.0)

## 2022-10-09 LAB — COMPREHENSIVE METABOLIC PANEL
ALT: 20 U/L (ref 0–53)
AST: 20 U/L (ref 0–37)
Albumin: 4.4 g/dL (ref 3.5–5.2)
Alkaline Phosphatase: 76 U/L (ref 39–117)
BUN: 14 mg/dL (ref 6–23)
CO2: 30 mEq/L (ref 19–32)
Calcium: 10 mg/dL (ref 8.4–10.5)
Chloride: 103 mEq/L (ref 96–112)
Creatinine, Ser: 0.79 mg/dL (ref 0.40–1.50)
GFR: 93.73 mL/min (ref >60.00)
Glucose, Bld: 93 mg/dL (ref 70–99)
Potassium: 4.7 mEq/L (ref 3.5–5.1)
Sodium: 141 mEq/L (ref 135–145)
Total Bilirubin: 0.6 mg/dL (ref 0.2–1.2)
Total Protein: 6.7 g/dL (ref 6.0–8.3)

## 2022-10-09 LAB — PSA: PSA: 0.42 ng/mL (ref 0.10–4.00)

## 2022-10-09 LAB — TSH: TSH: 1.4 u[IU]/mL (ref 0.35–5.50)

## 2023-02-11 ENCOUNTER — Ambulatory Visit (INDEPENDENT_AMBULATORY_CARE_PROVIDER_SITE_OTHER): Payer: 59 | Admitting: Family Medicine

## 2023-02-11 ENCOUNTER — Ambulatory Visit (INDEPENDENT_AMBULATORY_CARE_PROVIDER_SITE_OTHER): Payer: 59

## 2023-02-11 ENCOUNTER — Encounter: Payer: Self-pay | Admitting: Family Medicine

## 2023-02-11 VITALS — BP 130/68 | HR 66 | Temp 97.7°F | Resp 20 | Ht 75.0 in | Wt 224.0 lb

## 2023-02-11 DIAGNOSIS — M79671 Pain in right foot: Secondary | ICD-10-CM | POA: Diagnosis not present

## 2023-02-11 MED ORDER — IBUPROFEN 600 MG PO TABS: 600.0000 mg | ORAL_TABLET | Freq: Four times a day (QID) | ORAL | 0 refills | Status: AC | PRN

## 2023-02-11 NOTE — Progress Notes (Signed)
Assessment & Plan:  1. Acute pain of right foot Exercises provided for patient to complete at home. Encouraged Ibuprofen at least TID to help with pain and inflammation. Discussed rest, ice, compression, and elevation. - ibuprofen (ADVIL) 600 MG tablet; Take 1 tablet (600 mg total) by mouth every 6 (six) hours as needed for mild pain or moderate pain.  Dispense: 30 tablet; Refill: 0 - DG Foot Complete Right; Future   Follow up plan: Return if symptoms worsen or fail to improve.  Deliah Boston, MSN, APRN, FNP-C  Subjective:  HPI: Wayne [name] is a 67 y.o. male presenting on 02/11/2023 for right foot pain ("Popped" while playing tennis last night. Lateral foot pain )  Patient reports right foot pain that started last night. He was playing tennis when he rolled his foot; he does report hearing a popping sound at that time. He took Ibuprofen 400 mg last night and this morning, which has not been extremely helpful. He has also applied ice.    ROS: Negative unless specifically indicated above in HPI.   Relevant past medical history reviewed and updated as indicated.   Allergies and medications reviewed and updated.   Current Outpatient Medications:    cholecalciferol (VITAMIN D3) 25 MCG (1000 UNIT) tablet, Take 1,000 Units by mouth daily., Disp: , Rfl:    clotrimazole-betamethasone (LOTRISONE) cream, Apply 1 Application topically daily., Disp: 45 g, Rfl: 2   meloxicam (MOBIC) 15 MG tablet, Take 1 tablet (15 mg total) by mouth daily as needed for pain., Disp: 90 tablet, Rfl: 0   Omega-3 Fatty Acids (FISH OIL) 1000 MG CAPS, Take 2 capsules (2,000 mg total) by mouth daily. (Patient not taking: Reported on 02/11/2023), Disp: 100 capsule, Rfl: 3  No Known Allergies  Objective:   BP 130/68   Pulse 66   Temp 97.7 F (36.5 C)   Resp 20   Ht 6\' 3"  (1.905 m)   Wt 224 lb (101.6 kg)   BMI 28.00 kg/m    Physical Exam Vitals reviewed.  Constitutional:      General: He is not in acute  distress.    Appearance: Normal appearance. He is not ill-appearing, toxic-appearing or diaphoretic.  HENT:     Head: Normocephalic and atraumatic.  Eyes:     General: No scleral icterus.       Right eye: No discharge.        Left eye: No discharge.     Conjunctiva/sclera: Conjunctivae normal.  Cardiovascular:     Rate and Rhythm: Normal rate.  Pulmonary:     Effort: Pulmonary effort is normal. No respiratory distress.  Musculoskeletal:        General: Normal range of motion.     Cervical back: Normal range of motion.     Right foot: Normal range of motion. No deformity.       Feet:  Feet:     Right foot:     Skin integrity: Skin integrity normal.     Comments: Area of tenderness to palpation. Skin:    General: Skin is warm and dry.  Neurological:     Mental Status: He is alert and oriented to person, place, and time. Mental status is at baseline.  Psychiatric:        Mood and Affect: Mood normal.        Behavior: Behavior normal.        Thought Content: Thought content normal.        Judgment: Judgment normal.

## 2023-11-05 ENCOUNTER — Ambulatory Visit (INDEPENDENT_AMBULATORY_CARE_PROVIDER_SITE_OTHER): Payer: Medicare HMO | Admitting: Internal Medicine

## 2023-11-05 ENCOUNTER — Encounter: Payer: Self-pay | Admitting: Internal Medicine

## 2023-11-05 VITALS — BP 120/80 | HR 73 | Temp 98.3°F | Ht 75.0 in | Wt 220.0 lb

## 2023-11-05 DIAGNOSIS — M79605 Pain in left leg: Secondary | ICD-10-CM | POA: Diagnosis not present

## 2023-11-05 DIAGNOSIS — Z0000 Encounter for general adult medical examination without abnormal findings: Secondary | ICD-10-CM

## 2023-11-05 DIAGNOSIS — M7632 Iliotibial band syndrome, left leg: Secondary | ICD-10-CM | POA: Diagnosis not present

## 2023-11-05 DIAGNOSIS — R350 Frequency of micturition: Secondary | ICD-10-CM | POA: Diagnosis not present

## 2023-11-05 DIAGNOSIS — N401 Enlarged prostate with lower urinary tract symptoms: Secondary | ICD-10-CM

## 2023-11-05 LAB — LIPID PANEL
Cholesterol: 262 mg/dL — ABNORMAL HIGH (ref 0–200)
HDL: 60.2 mg/dL (ref >39.00)
LDL Cholesterol: 189 mg/dL — ABNORMAL HIGH (ref 0–99)
NonHDL: 201.52
Total CHOL/HDL Ratio: 4
Triglycerides: 62 mg/dL (ref 0.0–149.0)
VLDL: 12.4 mg/dL (ref 0.0–40.0)

## 2023-11-05 LAB — CBC WITH DIFFERENTIAL/PLATELET
Basophils Absolute: 0.1 10*3/uL (ref 0.0–0.1)
Basophils Relative: 0.9 % (ref 0.0–3.0)
Eosinophils Absolute: 0.2 10*3/uL (ref 0.0–0.7)
Eosinophils Relative: 4.2 % (ref 0.0–5.0)
HCT: 42.1 % (ref 39.0–52.0)
Hemoglobin: 14 g/dL (ref 13.0–17.0)
Lymphocytes Relative: 25.3 % (ref 12.0–46.0)
Lymphs Abs: 1.4 10*3/uL (ref 0.7–4.0)
MCHC: 33.4 g/dL (ref 30.0–36.0)
MCV: 91.8 fL (ref 78.0–100.0)
Monocytes Absolute: 0.5 10*3/uL (ref 0.1–1.0)
Monocytes Relative: 9.2 % (ref 3.0–12.0)
Neutro Abs: 3.5 10*3/uL (ref 1.4–7.7)
Neutrophils Relative %: 60.4 % (ref 43.0–77.0)
Platelets: 261 10*3/uL (ref 150.0–400.0)
RBC: 4.58 Mil/uL (ref 4.22–5.81)
RDW: 13.2 % (ref 11.5–15.5)
WBC: 5.7 10*3/uL (ref 4.0–10.5)

## 2023-11-05 LAB — COMPREHENSIVE METABOLIC PANEL
ALT: 26 U/L (ref 0–53)
AST: 23 U/L (ref 0–37)
Albumin: 4.6 g/dL (ref 3.5–5.2)
Alkaline Phosphatase: 62 U/L (ref 39–117)
BUN: 13 mg/dL (ref 6–23)
CO2: 28 meq/L (ref 19–32)
Calcium: 9.7 mg/dL (ref 8.4–10.5)
Chloride: 104 meq/L (ref 96–112)
Creatinine, Ser: 0.83 mg/dL (ref 0.40–1.50)
GFR: 91.65 mL/min (ref >60.00)
Glucose, Bld: 98 mg/dL (ref 70–99)
Potassium: 4.4 meq/L (ref 3.5–5.1)
Sodium: 141 meq/L (ref 135–145)
Total Bilirubin: 0.6 mg/dL (ref 0.2–1.2)
Total Protein: 6.9 g/dL (ref 6.0–8.3)

## 2023-11-05 LAB — URINALYSIS
Bilirubin Urine: NEGATIVE
Hgb urine dipstick: NEGATIVE
Ketones, ur: NEGATIVE
Leukocytes,Ua: NEGATIVE
Nitrite: NEGATIVE
Specific Gravity, Urine: 1.01 (ref 1.000–1.030)
Total Protein, Urine: NEGATIVE
Urine Glucose: NEGATIVE
Urobilinogen, UA: 0.2 (ref 0.0–1.0)
pH: 7 (ref 5.0–8.0)

## 2023-11-05 LAB — TSH: TSH: 2.64 u[IU]/mL (ref 0.35–5.50)

## 2023-11-05 LAB — PSA: PSA: 0.59 ng/mL (ref 0.10–4.00)

## 2023-11-05 MED ORDER — METHYLPREDNISOLONE 4 MG PO TBPK: ORAL_TABLET | ORAL | 0 refills | Status: DC

## 2023-11-05 MED ORDER — TIZANIDINE HCL 4 MG PO TABS: 4.0000 mg | ORAL_TABLET | Freq: Three times a day (TID) | ORAL | 1 refills | Status: DC | PRN

## 2023-11-05 NOTE — Assessment & Plan Note (Addendum)
 New, leg pain is related to IT band syndrome on the left Medrol pack Ice /heat - rice sock, massage with a tennis ball Hip opener exercises Blue-Emu cream was recommended to use 2-3 times a day

## 2023-11-05 NOTE — Progress Notes (Signed)
 Subjective:  Patient ID: Wayne [name], male    DOB: 19-Aug-1956  Age: 67 y.o. MRN: [id number]  CC: Pain (Pt states he is having pain in his buttocks area radiating down to his left leg. Pt has had hip surgery x1 year ago on the same side and states he did not injure hisself. Pt noticed pain after walking when he was out shopping x2 weeks ago)   HPI ENOCH MOFFA presents for urgency, pain in the left  leg, cramps in the thigh - has to use a cane sometimes x hours off and on.  The pain is intermittent.  It can be triggered by walking. S/p L THR. He did not played tennis since then Is worried about his prostate.  No weakness in the leg  Outpatient Medications Prior to Visit  Medication Sig Dispense Refill   cholecalciferol (VITAMIN D3) 25 MCG (1000 UNIT) tablet Take 1,000 Units by mouth daily.     clotrimazole -betamethasone  (LOTRISONE ) cream Apply 1 Application topically daily. 45 g 2   ibuprofen  (ADVIL ) 600 MG tablet Take 1 tablet (600 mg total) by mouth every 6 (six) hours as needed for mild pain or moderate pain. 30 tablet 0   amoxicillin (AMOXIL) 500 MG tablet Take 4 tablet(s) 1-2 hours prior to dental work by oral route. (Patient not taking: Reported on 11/05/2023)     Omega-3 Fatty Acids (FISH OIL ) 1000 MG CAPS Take 2 capsules (2,000 mg total) by mouth daily. (Patient not taking: Reported on 11/05/2023) 100 capsule 3   No facility-administered medications prior to visit.    ROS: Review of Systems  Constitutional:  Negative for appetite change, fatigue and unexpected weight change.  HENT:  Negative for congestion, nosebleeds, sneezing, sore throat and trouble swallowing.   Eyes:  Negative for itching and visual disturbance.  Respiratory:  Negative for cough.   Cardiovascular:  Negative for chest pain, palpitations and leg swelling.  Gastrointestinal:  Negative for abdominal distention, blood in stool, diarrhea and nausea.  Genitourinary:  Positive for urgency. Negative for  frequency and hematuria.  Musculoskeletal:  Positive for arthralgias and gait problem. Negative for back pain, joint swelling and neck pain.  Skin:  Negative for rash.  Neurological:  Negative for dizziness, tremors, speech difficulty and weakness.  Psychiatric/Behavioral:  Negative for agitation, dysphoric mood and sleep disturbance. The patient is not nervous/anxious.     Objective:  BP 120/80 (BP Location: Left Arm, Patient Position: Sitting, Cuff Size: Normal)   Pulse 73   Temp 98.3 F (36.8 C) (Oral)   Ht 6' 3 (1.905 m)   Wt 220 lb (99.8 kg)   SpO2 96%   BMI 27.50 kg/m   BP Readings from Last 3 Encounters:  11/05/23 120/80  02/11/23 130/68  10/03/22 118/62    Wt Readings from Last 3 Encounters:  11/05/23 220 lb (99.8 kg)  02/11/23 224 lb (101.6 kg)  10/03/22 226 lb (102.5 kg)    Physical Exam Constitutional:      General: He is not in acute distress.    Appearance: He is well-developed.     Comments: NAD  Eyes:     Conjunctiva/sclera: Conjunctivae normal.     Pupils: Pupils are equal, round, and reactive to light.  Neck:     Thyroid : No thyromegaly.     Vascular: No JVD.  Cardiovascular:     Rate and Rhythm: Normal rate and regular rhythm.     Heart sounds: Normal heart sounds. No murmur heard.  No friction rub. No gallop.  Pulmonary:     Effort: Pulmonary effort is normal. No respiratory distress.     Breath sounds: Normal breath sounds. No wheezing or rales.  Chest:     Chest wall: No tenderness.  Abdominal:     General: Bowel sounds are normal. There is no distension.     Palpations: Abdomen is soft. There is no mass.     Tenderness: There is no abdominal tenderness. There is no guarding or rebound.  Genitourinary:    Rectum: Guaiac result negative.  Musculoskeletal:        General: Tenderness present. Normal range of motion.     Cervical back: Normal range of motion.     Right lower leg: No edema.     Left lower leg: No edema.  Lymphadenopathy:      Cervical: No cervical adenopathy.  Skin:    General: Skin is warm and dry.     Findings: No rash.  Neurological:     Mental Status: He is alert and oriented to person, place, and time.     Cranial Nerves: No cranial nerve deficit.     Motor: No abnormal muscle tone.     Coordination: Coordination normal.     Gait: Gait normal.     Deep Tendon Reflexes: Reflexes are normal and symmetric.  Psychiatric:        Behavior: Behavior normal.        Thought Content: Thought content normal.        Judgment: Judgment normal.   Protate 1+ L IT band is tender to palpation Lab Results  Component Value Date   WBC 5.7 11/05/2023   HGB 14.0 11/05/2023   HCT 42.1 11/05/2023   PLT 261.0 11/05/2023   GLUCOSE 98 11/05/2023   CHOL 262 (H) 11/05/2023   TRIG 62.0 11/05/2023   HDL 60.20 11/05/2023   LDLDIRECT 155.3 05/05/2013   LDLCALC 189 (H) 11/05/2023   ALT 26 11/05/2023   AST 23 11/05/2023   NA 141 11/05/2023   K 4.4 11/05/2023   CL 104 11/05/2023   CREATININE 0.83 11/05/2023   BUN 13 11/05/2023   CO2 28 11/05/2023   TSH 2.64 11/05/2023   PSA 0.59 11/05/2023    CT CARDIAC SCORING (SELF PAY ONLY) Addendum Date: 10/18/2021 ADDENDUM REPORT: 10/18/2021 17:50 CLINICAL DATA:  Cardiovascular Disease Risk stratification EXAM: Coronary Calcium Score TECHNIQUE: A gated, non-contrast computed tomography scan of the heart was performed using 3mm slice thickness. Axial images were analyzed on a dedicated workstation. Calcium scoring of the coronary arteries was performed using the Agatston method. FINDINGS: Coronary arteries: Normal origins. Coronary Calcium Score: Left main: 0 Left anterior descending artery: 0 Left circumflex artery: 0 Right coronary artery: 3.05 Total: 3.05 Percentile: 27th Pericardium: Normal. Ascending Aorta: Aneurysmal aorta to 40 mm at the level of the main PA bifurcation. Non-cardiac: See separate report from [facility]. IMPRESSION: 1. Coronary calcium score of 3.05.  This was 27th percentile for age-, race-, and sex-matched controls. 2. Ascending Aorta: Aneurysmal aorta to 40 mm at the level of the main PA bifurcation. 3. Aortic valve calcifications. RECOMMENDATIONS: Coronary artery calcium (CAC) score is a strong predictor of incident coronary heart disease (CHD) and provides predictive information beyond traditional risk factors. CAC scoring is reasonable to use in the decision to withhold, postpone, or initiate statin therapy in intermediate-risk or selected borderline-risk asymptomatic adults (age 20-75 years and LDL-C >=70 to <190 mg/dL) who do not have diabetes or established atherosclerotic  cardiovascular disease (ASCVD).* In intermediate-risk (10-year ASCVD risk >=7.5% to <20%) adults or selected borderline-risk (10-year ASCVD risk >=5% to <7.5%) adults in whom a CAC score is measured for the purpose of making a treatment decision the following recommendations have been made: If CAC=0, it is reasonable to withhold statin therapy and reassess in 5 to 10 years, as long as higher risk conditions are absent (diabetes mellitus, family history of premature CHD in first degree relatives (males <55 years; females <65 years), cigarette smoking, or LDL >=190 mg/dL). If CAC is 1 to 99, it is reasonable to initiate statin therapy for patients >=41 years of age. If CAC is >=100 or >=75th percentile, it is reasonable to initiate statin therapy at any age. Cardiology referral should be considered for patients with CAC scores >=400 or >=75th percentile. *2018 AHA/ACC/AACVPR/AAPA/ABC/ACPM/ADA/AGS/APhA/ASPC/NLA/PCNA Guideline on the Management of Blood Cholesterol: A Report of the American College of Cardiology/American Heart Association Task Force on Clinical Practice Guidelines. J Am Coll Cardiol. 2019;73(24):3168-3209. Vinie Maxcy, MD Electronically Signed   By: Vinie JAYSON Maxcy M.D.   On: 10/18/2021 17:50   Result Date: 10/18/2021 EXAM: OVER-READ INTERPRETATION  CT CHEST The  following report is an over-read performed by radiologist Dr. Franky Crease of [facility] on 10/18/2021. This over-read does not include interpretation of cardiac or coronary anatomy or pathology. The dyslipidemia interpretation by the cardiologist is attached. COMPARISON:  None. FINDINGS: Vascular: Mild aneurysmal dilatation of the ascending thoracic aorta, 4.1 cm. Heart is normal size. Mediastinum/Nodes: No adenopathy Lungs/Pleura: Lungs clear.  No effusions. Upper Abdomen: Imaging into the upper abdomen demonstrates no acute findings. Musculoskeletal: Chest wall soft tissues are unremarkable. No acute bony abnormality. IMPRESSION: 4.1 cm ascending thoracic aortic aneurysm. Recommend annual imaging followup by CTA or MRA. This recommendation follows 2010 ACCF/AHA/AATS/ACR/ASA/SCA/SCAI/SIR/STS/SVM Guidelines for the Diagnosis and Management of Patients with Thoracic Aortic Disease. Circulation. 2010; 121: Z733-z630. Aortic aneurysm NOS (ICD10-I71.9) Electronically Signed: By: Franky Crease M.D. On: 10/18/2021 15:38    Assessment & Plan:   Problem List Items Addressed This Visit     LEG PAIN, LEFT   IT band syndrome.  Treatment-see above      Well adult exam   Relevant Orders   TSH (Completed)   CBC with Differential/Platelet (Completed)   Lipid panel (Completed)   PSA (Completed)   Comprehensive metabolic panel (Completed)   It band syndrome, left - Primary   New, leg pain is related to IT band syndrome on the left Medrol  pack Ice /heat - rice sock, massage with a tennis ball Hip opener exercises Blue-Emu cream was recommended to use 2-3 times a day       BPH (benign prostatic hyperplasia)   Slightly more symptomatic than before.  Prostate exam was done.  Labs ordered.      Other Visit Diagnoses       Frequency of urination       Relevant Orders   Urinalysis (Completed)         Meds ordered this encounter  Medications   methylPREDNISolone  (MEDROL  DOSEPAK) 4 MG  TBPK tablet    Sig: As directed    Dispense:  21 tablet    Refill:  0   tiZANidine  (ZANAFLEX ) 4 MG tablet    Sig: Take 1 tablet (4 mg total) by mouth every 8 (eight) hours as needed for muscle spasms.    Dispense:  30 tablet    Refill:  1      Follow-up: Return in about 4 weeks (  around 12/03/2023) for Wellness Exam.  Marolyn Noel, MD

## 2023-11-06 DIAGNOSIS — N400 Enlarged prostate without lower urinary tract symptoms: Secondary | ICD-10-CM | POA: Insufficient documentation

## 2023-11-06 NOTE — Assessment & Plan Note (Signed)
 IT band syndrome.  Treatment-see above

## 2023-11-06 NOTE — Assessment & Plan Note (Signed)
 Slightly more symptomatic than before.  Prostate exam was done.  Labs ordered.

## 2023-11-09 ENCOUNTER — Encounter: Payer: Self-pay | Admitting: Internal Medicine

## 2023-11-11 ENCOUNTER — Encounter: Payer: Self-pay | Admitting: Internal Medicine

## 2023-11-13 ENCOUNTER — Other Ambulatory Visit: Payer: Self-pay | Admitting: Internal Medicine

## 2023-11-13 MED ORDER — HYDROCORTISONE ACETATE 25 MG RE SUPP: 25.0000 mg | Freq: Two times a day (BID) | RECTAL | 35 refills | Status: DC

## 2023-11-13 MED ORDER — CIPROFLOXACIN HCL 500 MG PO TABS: 500.0000 mg | ORAL_TABLET | Freq: Two times a day (BID) | ORAL | 0 refills | Status: AC

## 2023-11-15 ENCOUNTER — Ambulatory Visit: Payer: Medicare HMO | Admitting: Internal Medicine

## 2023-11-27 ENCOUNTER — Encounter: Payer: Self-pay | Admitting: Internal Medicine

## 2023-11-27 ENCOUNTER — Ambulatory Visit: Payer: Medicare HMO | Admitting: Internal Medicine

## 2023-11-27 VITALS — BP 122/68 | HR 65 | Temp 98.2°F | Ht 75.0 in | Wt 218.0 lb

## 2023-11-27 DIAGNOSIS — K6289 Other specified diseases of anus and rectum: Secondary | ICD-10-CM

## 2023-11-27 DIAGNOSIS — Z1211 Encounter for screening for malignant neoplasm of colon: Secondary | ICD-10-CM

## 2023-11-27 DIAGNOSIS — M79605 Pain in left leg: Secondary | ICD-10-CM | POA: Diagnosis not present

## 2023-11-27 DIAGNOSIS — I2583 Coronary atherosclerosis due to lipid rich plaque: Secondary | ICD-10-CM

## 2023-11-27 DIAGNOSIS — E785 Hyperlipidemia, unspecified: Secondary | ICD-10-CM

## 2023-11-27 NOTE — Assessment & Plan Note (Signed)
Take fish oil

## 2023-11-27 NOTE — Progress Notes (Signed)
Subjective:  Patient ID: Wayne [name], male    DOB: 17-Dec-1955  Age: 68 y.o. MRN: [id number]  CC: Hemorrhoids   HPI DANELL SISK presents for IT band pain and rectal pain - better C/o new rectal pain (Nov 2024) - supp helped  Outpatient Medications Prior to Visit  Medication Sig Dispense Refill   cholecalciferol (VITAMIN D3) 25 MCG (1000 UNIT) tablet Take 1,000 Units by mouth daily.     clotrimazole-betamethasone (LOTRISONE) cream Apply 1 Application topically daily. 45 g 2   hydrocortisone (ANUSOL-HC) 25 MG suppository Place 1 suppository (25 mg total) rectally 2 (two) times daily. 24 suppository 35   ibuprofen (ADVIL) 600 MG tablet Take 1 tablet (600 mg total) by mouth every 6 (six) hours as needed for mild pain or moderate pain. 30 tablet 0   methylPREDNISolone (MEDROL DOSEPAK) 4 MG TBPK tablet As directed 21 tablet 0   Multiple Vitamins-Minerals (ICAPS AREDS 2 PO)      amoxicillin (AMOXIL) 500 MG tablet Take 4 tablet(s) 1-2 hours prior to dental work by oral route. (Patient not taking: Reported on 11/27/2023)     Omega-3 Fatty Acids (FISH OIL) 1000 MG CAPS Take 2 capsules (2,000 mg total) by mouth daily. (Patient not taking: Reported on 02/11/2023) 100 capsule 3   No facility-administered medications prior to visit.    ROS: Review of Systems  Constitutional:  Negative for appetite change, fatigue and unexpected weight change.  HENT:  Negative for congestion, nosebleeds, sneezing, sore throat and trouble swallowing.   Eyes:  Negative for itching and visual disturbance.  Respiratory:  Negative for cough.   Cardiovascular:  Negative for chest pain, palpitations and leg swelling.  Gastrointestinal:  Negative for abdominal distention, blood in stool, diarrhea and nausea.  Genitourinary:  Negative for frequency and hematuria.  Musculoskeletal:  Negative for back pain, gait problem, joint swelling and neck pain.  Skin:  Negative for color change and rash.  Neurological:  Negative  for dizziness, tremors, speech difficulty and weakness.  Psychiatric/Behavioral:  Negative for agitation, dysphoric mood and sleep disturbance. The patient is not nervous/anxious.     Objective:  BP 122/68 (BP Location: Left Arm, Patient Position: Sitting, Cuff Size: Normal)   Pulse 65   Temp 98.2 F (36.8 C) (Oral)   Ht 6\' 3"  (1.905 m)   Wt 218 lb (98.9 kg)   SpO2 96%   BMI 27.25 kg/m   BP Readings from Last 3 Encounters:  11/27/23 122/68  11/05/23 120/80  02/11/23 130/68    Wt Readings from Last 3 Encounters:  11/27/23 218 lb (98.9 kg)  11/05/23 220 lb (99.8 kg)  02/11/23 224 lb (101.6 kg)    Physical Exam Constitutional:      General: He is not in acute distress.    Appearance: He is well-developed.     Comments: NAD  Eyes:     Conjunctiva/sclera: Conjunctivae normal.     Pupils: Pupils are equal, round, and reactive to light.  Neck:     Thyroid: No thyromegaly.     Vascular: No JVD.  Cardiovascular:     Rate and Rhythm: Normal rate and regular rhythm.     Heart sounds: Normal heart sounds. No murmur heard.    No friction rub. No gallop.  Pulmonary:     Effort: Pulmonary effort is normal. No respiratory distress.     Breath sounds: Normal breath sounds. No wheezing or rales.  Chest:     Chest wall: No tenderness.  Abdominal:  General: Bowel sounds are normal. There is no distension.     Palpations: Abdomen is soft. There is no mass.     Tenderness: There is no abdominal tenderness. There is no guarding or rebound.  Musculoskeletal:        General: No tenderness. Normal range of motion.     Cervical back: Normal range of motion.  Lymphadenopathy:     Cervical: No cervical adenopathy.  Skin:    General: Skin is warm and dry.     Findings: No rash.  Neurological:     Mental Status: He is alert and oriented to person, place, and time.     Cranial Nerves: No cranial nerve deficit.     Motor: No abnormal muscle tone.     Coordination: Coordination  normal.     Gait: Gait normal.     Deep Tendon Reflexes: Reflexes are normal and symmetric.  Psychiatric:        Behavior: Behavior normal.        Thought Content: Thought content normal.        Judgment: Judgment normal.     Lab Results  Component Value Date   WBC 5.7 11/05/2023   HGB 14.0 11/05/2023   HCT 42.1 11/05/2023   PLT 261.0 11/05/2023   GLUCOSE 98 11/05/2023   CHOL 262 (H) 11/05/2023   TRIG 62.0 11/05/2023   HDL 60.20 11/05/2023   LDLDIRECT 155.3 05/05/2013   LDLCALC 189 (H) 11/05/2023   ALT 26 11/05/2023   AST 23 11/05/2023   NA 141 11/05/2023   K 4.4 11/05/2023   CL 104 11/05/2023   CREATININE 0.83 11/05/2023   BUN 13 11/05/2023   CO2 28 11/05/2023   TSH 2.64 11/05/2023   PSA 0.59 11/05/2023    CT CARDIAC SCORING (SELF PAY ONLY) Addendum Date: 10/18/2021 ADDENDUM REPORT: 10/18/2021 17:50 CLINICAL DATA:  Cardiovascular Disease Risk stratification EXAM: Coronary Calcium Score TECHNIQUE: A gated, non-contrast computed tomography scan of the heart was performed using 3mm slice thickness. Axial images were analyzed on a dedicated workstation. Calcium scoring of the coronary arteries was performed using the Agatston method. FINDINGS: Coronary arteries: Normal origins. Coronary Calcium Score: Left main: 0 Left anterior descending artery: 0 Left circumflex artery: 0 Right coronary artery: 3.05 Total: 3.05 Percentile: 27th Pericardium: Normal. Ascending Aorta: Aneurysmal aorta to 40 mm at the level of the main PA bifurcation. Non-cardiac: See separate report from [facility]. IMPRESSION: 1. Coronary calcium score of 3.05. This was 27th percentile for age-, race-, and sex-matched controls. 2. Ascending Aorta: Aneurysmal aorta to 40 mm at the level of the main PA bifurcation. 3. Aortic valve calcifications. RECOMMENDATIONS: Coronary artery calcium (CAC) score is a strong predictor of incident coronary heart disease (CHD) and provides predictive information beyond  traditional risk factors. CAC scoring is reasonable to use in the decision to withhold, postpone, or initiate statin therapy in intermediate-risk or selected borderline-risk asymptomatic adults (age 39-75 years and LDL-C >=70 to <190 mg/dL) who do not have diabetes or established atherosclerotic cardiovascular disease (ASCVD).* In intermediate-risk (10-year ASCVD risk >=7.5% to <20%) adults or selected borderline-risk (10-year ASCVD risk >=5% to <7.5%) adults in whom a CAC score is measured for the purpose of making a treatment decision the following recommendations have been made: If CAC=0, it is reasonable to withhold statin therapy and reassess in 5 to 10 years, as long as higher risk conditions are absent (diabetes mellitus, family history of premature CHD in first degree relatives (males <55 years; females <  65 years), cigarette smoking, or LDL >=190 mg/dL). If CAC is 1 to 99, it is reasonable to initiate statin therapy for patients >=60 years of age. If CAC is >=100 or >=75th percentile, it is reasonable to initiate statin therapy at any age. Cardiology referral should be considered for patients with CAC scores >=400 or >=75th percentile. *2018 AHA/ACC/AACVPR/AAPA/ABC/ACPM/ADA/AGS/APhA/ASPC/NLA/PCNA Guideline on the Management of Blood Cholesterol: A Report of the American College of Cardiology/American Heart Association Task Force on Clinical Practice Guidelines. J Am Coll Cardiol. 2019;73(24):3168-3209. Zoila Shutter, MD Electronically Signed   By: Chrystie Nose M.D.   On: 10/18/2021 17:50   Result Date: 10/18/2021 EXAM: OVER-READ INTERPRETATION  CT CHEST The following report is an over-read performed by radiologist Dr. Charlett Nose of [facility] on 10/18/2021. This over-read does not include interpretation of cardiac or coronary anatomy or pathology. The dyslipidemia interpretation by the cardiologist is attached. COMPARISON:  None. FINDINGS: Vascular: Mild aneurysmal dilatation of the  ascending thoracic aorta, 4.1 cm. Heart is normal size. Mediastinum/Nodes: No adenopathy Lungs/Pleura: Lungs clear.  No effusions. Upper Abdomen: Imaging into the upper abdomen demonstrates no acute findings. Musculoskeletal: Chest wall soft tissues are unremarkable. No acute bony abnormality. IMPRESSION: 4.1 cm ascending thoracic aortic aneurysm. Recommend annual imaging followup by CTA or MRA. This recommendation follows 2010 ACCF/AHA/AATS/ACR/ASA/SCA/SCAI/SIR/STS/SVM Guidelines for the Diagnosis and Management of Patients with Thoracic Aortic Disease. Circulation. 2010; 121: X914-N829. Aortic aneurysm NOS (ICD10-I71.9) Electronically Signed: By: Charlett Nose M.D. On: 10/18/2021 15:38    Assessment & Plan:   Problem List Items Addressed This Visit     LEG PAIN, LEFT - Primary   IT band syndrome.  Better w/treatment       Dyslipidemia   Mild. Declined statins CT coronary calcium score is 3.05.    Take fish oil      Coronary atherosclerosis   Take fish oil      Anal or rectal pain   New rectal pain (Nov 2024) - much bette w/supp use Sch Colonoscopy      Other Visit Diagnoses       Screening for colon cancer       Relevant Orders   Ambulatory referral to Gastroenterology         No orders of the defined types were placed in this encounter.     Follow-up: Return in about 6 months (around 05/26/2024) for a follow-up visit.  Sonda Primes, MD

## 2023-11-27 NOTE — Assessment & Plan Note (Signed)
New rectal pain (Nov 2024) - much bette w/supp use Sch Colonoscopy

## 2023-11-27 NOTE — Assessment & Plan Note (Signed)
IT band syndrome.  Better w/treatment

## 2023-11-27 NOTE — Assessment & Plan Note (Addendum)
Mild. Declined statins CT coronary calcium score is 3.05.    Take fish oil

## 2023-12-04 ENCOUNTER — Other Ambulatory Visit: Payer: Self-pay | Admitting: Internal Medicine

## 2023-12-04 MED ORDER — HYDROCORTISONE 2.5 % EX CREA: TOPICAL_CREAM | Freq: Three times a day (TID) | CUTANEOUS | 1 refills | Status: DC

## 2023-12-12 NOTE — Progress Notes (Signed)
 Chief Complaint: rectal discomfort, hemorrhoids Primary GI Doctor: (previously Dr. Teressa) Dr. Legrand  HPI: Patient is a 68 year old male patient with past medical history of arthritis, diverticulosis, and hyperlipidemia, who was referred to me by Plotnikov, Karlynn GAILS, MD on 11/27/23 for rectal discomfort and hemorrhoids.  10/12/2014 colonoscopy -There were diverticular changes throughout the colon, most significant on left side. The examination was otherwise normal. No polyps or cancers. Recall 10 years.  Interval History     Patient presents for main complaint of rectal discomfort after acute episode of constipation. Patient reports he had episode of constipation right before Thanksgiving. He was taking oral vitamins for his eyes that had side effect of constipation so he stopped it and the constipation resolved. He reports shortly after the episode of constipation he went on a long car ride for trip and started experiencing rectal pain/discomfort so his PCP prescribed rectal suppositories and topical ointment for possible hemorrhoids. He reports he used it for 3 weeks and had some improvement, but states he still felt mild discomfort and wanted us  to examine it today. No bleeding. No blood thinners. Patient's family history includes prostate CA in his father.   Wt Readings from Last 3 Encounters:  12/13/23 221 lb (100.2 kg)  11/27/23 218 lb (98.9 kg)  11/05/23 220 lb (99.8 kg)    Past Medical History:  Diagnosis Date   Arthritis    Hyperlipidemia    slightly elevated, no meds   Varicose veins    Past Surgical History:  Procedure Laterality Date   CATARACT EXTRACTION Right    ENDOVENOUS ABLATION SAPHENOUS VEIN W/ LASER Left 08/10/2009   left greater saphenous vein   ENDOVENOUS ABLATION SAPHENOUS VEIN W/ LASER Left 10/08/2013   EVLA left proximal greater saphenous vein and stab phlebectomy 10-20 incisions left leg by Krystal Doing MD   REPLACEMENT TOTAL HIP W/  RESURFACING IMPLANTS Left     VASECTOMY      Current Outpatient Medications  Medication Sig Dispense Refill   cholecalciferol (VITAMIN D3) 25 MCG (1000 UNIT) tablet Take 1,000 Units by mouth daily.     clotrimazole -betamethasone  (LOTRISONE ) cream Apply 1 Application topically daily. 45 g 2   hydrocortisone  (ANUSOL -HC) 25 MG suppository Place 1 suppository (25 mg total) rectally 2 (two) times daily. 24 suppository 35   hydrocortisone  2.5 % cream Apply topically 3 (three) times daily. As directed 60 g 1   ibuprofen  (ADVIL ) 600 MG tablet Take 1 tablet (600 mg total) by mouth every 6 (six) hours as needed for mild pain or moderate pain. 30 tablet 0   Multiple Vitamins-Minerals (ICAPS AREDS 2 PO)      amoxicillin (AMOXIL) 500 MG tablet Take 4 tablet(s) 1-2 hours prior to dental work by oral route. (Patient not taking: Reported on 11/05/2023)     Omega-3 Fatty Acids (FISH OIL ) 1000 MG CAPS Take 2 capsules (2,000 mg total) by mouth daily. (Patient not taking: Reported on 12/13/2023) 100 capsule 3   No current facility-administered medications for this visit.   Allergies as of 12/13/2023   (No Known Allergies)   Family History  Problem Relation Age of Onset   Diabetes Father    Hypertension Mother    Colon cancer Neg Hx    Esophageal cancer Neg Hx    Rectal cancer Neg Hx    Stomach cancer Neg Hx    Review of Systems:    Constitutional: No weight loss, fever, chills, weakness or fatigue HEENT: Eyes: No change in  vision               Ears, Nose, Throat:  No change in hearing or congestion Skin: No rash or itching Cardiovascular: No chest pain, chest pressure or palpitations   Respiratory: No SOB or cough Gastrointestinal: See HPI and otherwise negative Genitourinary: No dysuria or change in urinary frequency Neurological: No headache, dizziness or syncope Musculoskeletal: No new muscle or joint pain Hematologic: No bleeding or bruising Psychiatric: No history of depression or anxiety   Physical Exam:  Vital  signs: BP 120/70 (BP Location: Left Arm, Patient Position: Sitting, Cuff Size: Normal)   Pulse 73   Ht 6' 3 (1.905 m)   Wt 221 lb (100.2 kg)   SpO2 98%   BMI 27.62 kg/m   Constitutional:   Pleasant Caucasian male appears to be in NAD, Well developed, Well nourished, alert and cooperative Neck:  Supple Throat: Oral cavity and pharynx without inflammation, swelling or lesion.  Respiratory: Respirations even and unlabored. Lungs clear to auscultation bilaterally.   No wheezes, crackles, or rhonchi.  Cardiovascular: Normal S1, S2. Regular rate and rhythm. No peripheral edema, cyanosis or pallor.  Gastrointestinal:  Soft, nondistended, nontender. No rebound or guarding. Normal bowel sounds. No appreciable masses or hepatomegaly. Rectal: Normal external rectal exam, normal rectal tone, appreciated internal grade II hemorrhoids, non-tender, no masses, , brown stool, hemoccult N/A  Anoscopy:appreciated internal grade II hemorrhoids, no anal fissure noted Msk:  Symmetrical without gross deformities. Without edema, no deformity or joint abnormality.  Neurologic:  Alert and  oriented x4;  grossly normal neurologically.  Skin:   Dry and intact without significant lesions or rashes. Psychiatric: Oriented to person, place and time. Demonstrates good judgement and reason without abnormal affect or behaviors.  RELEVANT LABS AND IMAGING: CBC    Latest Ref Rng & Units 11/05/2023    8:53 AM 10/09/2022    7:55 AM 09/04/2021    8:24 AM  CBC  WBC 4.0 - 10.5 K/uL 5.7  5.9  5.7   Hemoglobin 13.0 - 17.0 g/dL 85.9  86.2  86.5   Hematocrit 39.0 - 52.0 % 42.1  40.9  39.5   Platelets 150.0 - 400.0 K/uL 261.0  261.0  248.0      CMP     Latest Ref Rng & Units 11/05/2023    8:53 AM 10/09/2022    7:55 AM 09/04/2021    8:24 AM  CMP  Glucose 70 - 99 mg/dL 98  93  92   BUN 6 - 23 mg/dL 13  14  13    Creatinine 0.40 - 1.50 mg/dL 9.16  9.20  9.17   Sodium 135 - 145 mEq/L 141  141  140   Potassium 3.5 - 5.1  mEq/L 4.4  4.7  4.4   Chloride 96 - 112 mEq/L 104  103  103   CO2 19 - 32 mEq/L 28  30  30    Calcium 8.4 - 10.5 mg/dL 9.7  89.9  9.7   Total Protein 6.0 - 8.3 g/dL 6.9  6.7  6.7   Total Bilirubin 0.2 - 1.2 mg/dL 0.6  0.6  0.7   Alkaline Phos 39 - 117 U/L 62  76  55   AST 0 - 37 U/L 23  20  19    ALT 0 - 53 U/L 26  20  17      Lab Results  Component Value Date   TSH 2.64 11/05/2023    Assessment: Encounter Diagnoses  Name Primary?  Internal hemorrhoids Yes   Rectal discomfort    Acute constipation      68 year old male patient who presents after acute episode of drug induced constipation with internal hemorrhoids causing rectal discomfort. He used prescribed topical ointment which helped, however he wanted to be evaluated today to make sure nothing else going on. He had internal hemorrhoids grade II on exam, recommended hemorrhoid banding. Declined for now, will follow-up and if further issues will contact our office. Educated on high fiber diet and no straining. Due for colon screening colonoscopy in December.   Plan: - Reinforced High fiber diet -Miralax po daily if needed for constipation -No straining, sitting on toilet long periods of time -Recommend hemorrhoid banding, he would like to hold off for now -colonoscopy recall 10/2024  Thank you for the courtesy of this consult. Please call me with any questions or concerns.   Toivo Bordon, FNP-C [facility] 12/13/2023, 4:03 PM  Cc: Plotnikov, Aleksei V, MD

## 2023-12-13 ENCOUNTER — Encounter: Payer: Self-pay | Admitting: Gastroenterology

## 2023-12-13 ENCOUNTER — Ambulatory Visit: Payer: Medicare HMO | Admitting: Gastroenterology

## 2023-12-13 VITALS — BP 120/70 | HR 73 | Ht 75.0 in | Wt 221.0 lb

## 2023-12-13 DIAGNOSIS — K6289 Other specified diseases of anus and rectum: Secondary | ICD-10-CM | POA: Diagnosis not present

## 2023-12-13 DIAGNOSIS — K648 Other hemorrhoids: Secondary | ICD-10-CM

## 2023-12-13 DIAGNOSIS — K641 Second degree hemorrhoids: Secondary | ICD-10-CM

## 2023-12-13 DIAGNOSIS — K5900 Constipation, unspecified: Secondary | ICD-10-CM

## 2023-12-13 NOTE — Patient Instructions (Addendum)
 Please call the office to follow-up within 3 months.  Please purchase the following medications over the counter and take as directed:  Use Miralax 1 capful daily as need when experiencing constipation.  _______________________________________________________  If your blood pressure at your visit was 140/90 or greater, please contact your primary care physician to follow up on this.  _______________________________________________________  If you are age 19 or older, your body mass index should be between 23-30. Your Body mass index is 27.62 kg/m. If this is out of the aforementioned range listed, please consider follow up with your Primary Care Provider.  If you are age 64 or younger, your body mass index should be between 19-25. Your Body mass index is 27.62 kg/m. If this is out of the aformentioned range listed, please consider follow up with your Primary Care Provider.   ________________________________________________________  The [facility] providers would like to encourage you to use MYCHART to communicate with providers for non-urgent requests or questions.  Due to long hold times on the telephone, sending your provider a message by [facility] may be a faster and more efficient way to get a response.  Please allow 48 business hours for a response.  Please remember that this is for non-urgent requests.  _______________________________________________________  Thank you for trusting me with your gastrointestinal care!   Deanna May, NP

## 2023-12-16 NOTE — Progress Notes (Signed)
 ____________________________________________________________  Attending physician addendum:  Thank you for sending this case to me. I have reviewed the entire note and agree with the plan.   Amada Jupiter, MD  ____________________________________________________________

## 2023-12-18 ENCOUNTER — Telehealth: Payer: Self-pay | Admitting: Gastroenterology

## 2023-12-18 NOTE — Telephone Encounter (Signed)
Good afternoon Dr. Myrtie Neither,   Patient is requesting a transfer of care to Dr. Leone Payor. Patient was a previous patient of Dr. Christella Hartigan. Patient states he would like a sooner appointment for the Hemorid banding that was discussed on 12/13/2023. Please advise.   Thank you

## 2023-12-18 NOTE — Progress Notes (Deleted)
 Chief Complaint: follow-up rectal discomfort and hemorrhoids Primary GI Doctor:Dr. Myrtie Neither  HPI:  Patient is a  68  year old male patient with past medical history of *****who was referred to me by Plotnikov, Georgina Quint, MD on **** for follow-up on rectal discomfort and hemorrhoids .    Interval History  Patient admits/denies GERD Patient admits/denies dysphagia Patient admits/denies nausea, vomiting, or weight loss  Patient admits/denies altered bowel habits Patient admits/denies abdominal pain Patient admits/denies rectal bleeding   Denies/Admits alcohol Denies/Admits smoking Denies/Admits NSAID use. Denies/Admits they are on blood thinners.  Patients last colonoscopy Patients last EGD  Patient's family history includes  Wt Readings from Last 3 Encounters:  12/13/23 221 lb (100.2 kg)  11/27/23 218 lb (98.9 kg)  11/05/23 220 lb (99.8 kg)      Past Medical History:  Diagnosis Date   Arthritis    Hyperlipidemia    slightly elevated, no meds   Varicose veins     Past Surgical History:  Procedure Laterality Date   CATARACT EXTRACTION Right    ENDOVENOUS ABLATION SAPHENOUS VEIN W/ LASER Left 08/10/2009   left greater saphenous vein   ENDOVENOUS ABLATION SAPHENOUS VEIN W/ LASER Left 10/08/2013   EVLA left proximal greater saphenous vein and stab phlebectomy 10-20 incisions left leg by Gretta Began MD   REPLACEMENT TOTAL HIP W/  RESURFACING IMPLANTS Left    VASECTOMY      Current Outpatient Medications  Medication Sig Dispense Refill   amoxicillin (AMOXIL) 500 MG tablet Take 4 tablet(s) 1-2 hours prior to dental work by oral route. (Patient not taking: Reported on 11/05/2023)     cholecalciferol (VITAMIN D3) 25 MCG (1000 UNIT) tablet Take 1,000 Units by mouth daily.     clotrimazole-betamethasone (LOTRISONE) cream Apply 1 Application topically daily. 45 g 2   hydrocortisone (ANUSOL-HC) 25 MG suppository Place 1 suppository (25 mg total) rectally 2 (two) times daily.  24 suppository 35   hydrocortisone 2.5 % cream Apply topically 3 (three) times daily. As directed 60 g 1   ibuprofen (ADVIL) 600 MG tablet Take 1 tablet (600 mg total) by mouth every 6 (six) hours as needed for mild pain or moderate pain. 30 tablet 0   Multiple Vitamins-Minerals (ICAPS AREDS 2 PO)      Omega-3 Fatty Acids (FISH OIL) 1000 MG CAPS Take 2 capsules (2,000 mg total) by mouth daily. (Patient not taking: Reported on 12/13/2023) 100 capsule 3   No current facility-administered medications for this visit.    Allergies as of 12/19/2023   (No Known Allergies)    Family History  Problem Relation Age of Onset   Diabetes Father    Hypertension Mother    Colon cancer Neg Hx    Esophageal cancer Neg Hx    Rectal cancer Neg Hx    Stomach cancer Neg Hx     Review of Systems:    Constitutional: No weight loss, fever, chills, weakness or fatigue HEENT: Eyes: No change in vision               Ears, Nose, Throat:  No change in hearing or congestion Skin: No rash or itching Cardiovascular: No chest pain, chest pressure or palpitations   Respiratory: No SOB or cough Gastrointestinal: See HPI and otherwise negative Genitourinary: No dysuria or change in urinary frequency Neurological: No headache, dizziness or syncope Musculoskeletal: No new muscle or joint pain Hematologic: No bleeding or bruising Psychiatric: No history of depression or anxiety    Physical  Exam:  Vital signs: There were no vitals taken for this visit.  Constitutional:   Pleasant Caucasian male*** appears to be in NAD, Well developed, Well nourished, alert and cooperative Head:  Normocephalic and atraumatic. Eyes:   PEERL, EOMI. No icterus. Conjunctiva pink. Ears:  Normal auditory acuity. Neck:  Supple Throat: Oral cavity and pharynx without inflammation, swelling or lesion.  Respiratory: Respirations even and unlabored. Lungs clear to auscultation bilaterally.   No wheezes, crackles, or rhonchi.   Cardiovascular: Normal S1, S2. Regular rate and rhythm. No peripheral edema, cyanosis or pallor.  Gastrointestinal:  Soft, nondistended, nontender. No rebound or guarding. Normal bowel sounds. No appreciable masses or hepatomegaly. Rectal:  Not performed.  Anoscopy: Msk:  Symmetrical without gross deformities. Without edema, no deformity or joint abnormality.  Neurologic:  Alert and  oriented x4;  grossly normal neurologically.  Skin:   Dry and intact without significant lesions or rashes. Psychiatric: Oriented to person, place and time. Demonstrates good judgement and reason without abnormal affect or behaviors.  RELEVANT LABS AND IMAGING: CBC    Latest Ref Rng & Units 11/05/2023    8:53 AM 10/09/2022    7:55 AM 09/04/2021    8:24 AM  CBC  WBC 4.0 - 10.5 K/uL 5.7  5.9  5.7   Hemoglobin 13.0 - 17.0 g/dL 60.4  54.0  98.1   Hematocrit 39.0 - 52.0 % 42.1  40.9  39.5   Platelets 150.0 - 400.0 K/uL 261.0  261.0  248.0      CMP     Latest Ref Rng & Units 11/05/2023    8:53 AM 10/09/2022    7:55 AM 09/04/2021    8:24 AM  CMP  Glucose 70 - 99 mg/dL 98  93  92   BUN 6 - 23 mg/dL 13  14  13    Creatinine 0.40 - 1.50 mg/dL 1.91  4.78  2.95   Sodium 135 - 145 mEq/L 141  141  140   Potassium 3.5 - 5.1 mEq/L 4.4  4.7  4.4   Chloride 96 - 112 mEq/L 104  103  103   CO2 19 - 32 mEq/L 28  30  30    Calcium 8.4 - 10.5 mg/dL 9.7  62.1  9.7   Total Protein 6.0 - 8.3 g/dL 6.9  6.7  6.7   Total Bilirubin 0.2 - 1.2 mg/dL 0.6  0.6  0.7   Alkaline Phos 39 - 117 U/L 62  76  55   AST 0 - 37 U/L 23  20  19    ALT 0 - 53 U/L 26  20  17       Lab Results  Component Value Date   TSH 2.64 11/05/2023     Assessment: 1. ***  Plan: 1. ***   Thank you for the courtesy of this consult. Please call me with any questions or concerns.   Amando Ishikawa, FNP-C [facility] 12/18/2023, 10:06 AM  Cc: Plotnikov, Georgina Quint, MD

## 2023-12-18 NOTE — Telephone Encounter (Signed)
error 

## 2023-12-19 ENCOUNTER — Ambulatory Visit: Payer: Medicare HMO | Admitting: Gastroenterology

## 2023-12-19 NOTE — Telephone Encounter (Signed)
Good afternoon Dr. Leone Payor,   Patient is requesting a transfer of care from Dr. Myrtie Neither to you. Patient is requesting a sooner appointment for the hemorrhoid banding. Can you please advise on scheduling?    Thank you

## 2023-12-19 NOTE — Telephone Encounter (Signed)
   I have not previously seen this patient, and I happened to be supervising physician when he was seen in the office on 12/13/2023.  So transfer of care is okay with me, but Dr. Leone Payor would have to be both available and willing to do that.  Ellwood Dense MD

## 2023-12-20 NOTE — Telephone Encounter (Signed)
He can be booked into a banding spot with me - let him know I will assess him and review his situation prior to performing banding (it is not 100% guaranteed that I will band)

## 2023-12-23 ENCOUNTER — Telehealth: Payer: Self-pay | Admitting: Gastroenterology

## 2023-12-23 NOTE — Telephone Encounter (Signed)
 Inbound call from patient requesting to know if he is able to have a prescription called in for pain of his hemorrhoids. Requesting a call back. Please advise, thank you.

## 2023-12-23 NOTE — Telephone Encounter (Signed)
 Patient has been scheduled for 3/11.

## 2023-12-24 ENCOUNTER — Other Ambulatory Visit: Payer: Self-pay | Admitting: Internal Medicine

## 2023-12-24 MED ORDER — HYDROCORTISONE 2.5 % EX CREA: TOPICAL_CREAM | Freq: Three times a day (TID) | CUTANEOUS | 1 refills | Status: DC

## 2023-12-24 NOTE — Telephone Encounter (Signed)
 Copied from CRM [id number]. Topic: Clinical - Medication Refill >> Dec 24, 2023  9:03 AM Truddie Crumble wrote: Most Recent Primary Care Visit:  Provider: Tresa Garter  Department: [facility]  Visit Type: OFFICE VISIT  Date: 11/27/2023  Medication: hydrocortisone 2.5 % cream  Has the patient contacted their pharmacy? No (Agent: If no, request that the patient contact the pharmacy for the refill. If patient does not wish to contact the pharmacy document the reason why and proceed with request.) (Agent: If yes, when and what did the pharmacy advise?)  Is this the correct pharmacy for this prescription? Yes If no, delete pharmacy and type the correct one.  This is the patient's preferred pharmacy:   [facility] [phone] [place] [address] Phone-[phone]  Has the prescription been filled recently? No  Is the patient out of the medication? Yes  Has the patient been seen for an appointment in the last year OR does the patient have an upcoming appointment? Yes  Can we respond through MyChart? Yes  Agent: Please be advised that Rx refills may take up to 3 business days. We ask that you follow-up with your pharmacy.

## 2023-12-27 ENCOUNTER — Telehealth: Payer: Self-pay | Admitting: Gastroenterology

## 2023-12-27 ENCOUNTER — Encounter: Payer: Self-pay | Admitting: Internal Medicine

## 2023-12-27 NOTE — Telephone Encounter (Signed)
 [employer] office called requesting a claim to be sent since it's not going to be covered at [phone]

## 2023-12-30 NOTE — Telephone Encounter (Signed)
 Pt stated that [facility] was requesting a copy of the referral that was sent for the pt to see a GI Dr. Referral was sent as requested to  [id number]. Pt made aware. Pt verbalized understanding with all questions answered.

## 2023-12-31 ENCOUNTER — Other Ambulatory Visit: Payer: Self-pay | Admitting: Internal Medicine

## 2023-12-31 MED ORDER — HYDROCORTISONE 2.5 % EX CREA: TOPICAL_CREAM | Freq: Three times a day (TID) | CUTANEOUS | 2 refills | Status: AC

## 2024-01-06 ENCOUNTER — Encounter: Payer: Self-pay | Admitting: Internal Medicine

## 2024-01-14 ENCOUNTER — Encounter: Payer: Self-pay | Admitting: Internal Medicine

## 2024-01-14 ENCOUNTER — Ambulatory Visit: Payer: Medicare HMO | Admitting: Internal Medicine

## 2024-01-14 VITALS — BP 122/60 | HR 54 | Ht 75.0 in | Wt 222.0 lb

## 2024-01-14 DIAGNOSIS — K641 Second degree hemorrhoids: Secondary | ICD-10-CM | POA: Insufficient documentation

## 2024-01-14 HISTORY — PX: HEMORRHOID BANDING: SHX5850

## 2024-01-14 NOTE — Progress Notes (Signed)
   HEMORRHOID LIGATION  Indication: Symptomatic hemorrhoids grade 2 internal Symptoms are pressure and discomfort and anal burning.  Began after a period of constipation and have been helped but not resolved using hydrocortisone cream.  Rectal exam palpable internal hemorrhoids left lateral on right posterior.  No mass.  Formed brown stool.  Anoscopy shows grade 2 internal hemorrhoids left lateral and right posterior grade 1 right anterior.  The grade 2 hemorrhoids are mildly inflamed.  PROCEDURE NOTE: The patient presents with symptomatic grade 2  hemorrhoids, requesting rubber band ligation of his/her hemorrhoidal disease.  All risks, benefits and alternative forms of therapy were described and informed consent was obtained.   The anorectum was pre-medicated with 5% lidocaine cream and 0.125% nitroglycerin ointment The decision was made to band the left lateral and right posterior internal hemorrhoids, and the [facility] O'Regan System was used to perform band ligation without complication.  Digital anorectal examination was then performed to assure proper positioning of the band, and to adjust the banded tissue as required.  The patient was discharged home without pain or other issues.  Dietary and behavioral recommendations were given and along with follow-up instructions.     The following adjunctive treatments were recommended:  Benefiber 1 tablespoon daily x 1 month  The patient will return as needed for  follow-up and possible additional banding as required. No complications were encountered and the patient tolerated the procedure well.

## 2024-01-14 NOTE — Patient Instructions (Signed)

## 2024-02-10 ENCOUNTER — Encounter: Payer: Self-pay | Admitting: Internal Medicine

## 2024-02-17 ENCOUNTER — Other Ambulatory Visit: Payer: Self-pay

## 2024-02-17 DIAGNOSIS — K649 Unspecified hemorrhoids: Secondary | ICD-10-CM

## 2024-02-17 NOTE — Telephone Encounter (Signed)
 Called and spoke with patient. They informed me that they had already reached out to a member of [facility] team and that they had called [employer]. They had informed them that the referral was placed incorrectly but was not sure exactly what was wrong. When looking at the referral I had noticed that it was done with the diagnosis of colon cancer screening. In the appointment notes (11/27/23) both the anal/rectal pain and hemorrhoids were noted but were not used as diagnoses attached to the GI referral.

## 2024-02-26 ENCOUNTER — Ambulatory Visit: Payer: Medicare HMO | Admitting: Gastroenterology

## 2024-02-26 ENCOUNTER — Encounter: Payer: Medicare HMO | Admitting: Gastroenterology

## 2024-02-28 ENCOUNTER — Other Ambulatory Visit: Payer: Self-pay

## 2024-02-28 NOTE — Addendum Note (Signed)
 Addended by: Dauntae Derusha E on: 02/28/2024 09:09 AM   Modules accepted: Orders

## 2024-03-06 NOTE — Telephone Encounter (Signed)
 Copied from CRM [id number]. Topic: Referral - Status >> Mar 06, 2024 11:58 AM Bambi Bonine D wrote: Reason for CRM: Lethu with [facility] is calling in regards to a referral request sent to the office regarding a specialist. I informed Michel Agreste that the referral was faxed over on 03/03/24 and shows that it went through on their end. Michel Agreste stated that they never received the fax and may need it to be re faxed at [phone].

## 2024-03-06 NOTE — Telephone Encounter (Signed)
 This has been re-faxed this afternoon with confirmation. I also have sent over the last set of appointment notes incase these were needed.

## 2024-03-09 ENCOUNTER — Telehealth: Payer: Self-pay | Admitting: Internal Medicine

## 2024-03-09 NOTE — Telephone Encounter (Signed)
 Copied from CRM [id number]. Topic: General - Other >> Mar 06, 2024  4:58 PM Chuck Crater wrote: Reason for CRM: Randine Butcher received referral Authorization for outpatient by fax regarding patient but it did not have a specific request on it. He is going to send a [employer] form with the information that they need.  [phone]

## 2024-03-10 ENCOUNTER — Other Ambulatory Visit

## 2024-03-10 ENCOUNTER — Ambulatory Visit (INDEPENDENT_AMBULATORY_CARE_PROVIDER_SITE_OTHER): Admitting: Internal Medicine

## 2024-03-10 ENCOUNTER — Encounter: Payer: Self-pay | Admitting: Internal Medicine

## 2024-03-10 ENCOUNTER — Ambulatory Visit

## 2024-03-10 VITALS — BP 120/68 | HR 59 | Ht 72.0 in | Wt 213.8 lb

## 2024-03-10 VITALS — BP 120/68 | HR 59 | Ht 72.0 in | Wt 213.0 lb

## 2024-03-10 DIAGNOSIS — Z0000 Encounter for general adult medical examination without abnormal findings: Secondary | ICD-10-CM

## 2024-03-10 DIAGNOSIS — S4991XA Unspecified injury of right shoulder and upper arm, initial encounter: Secondary | ICD-10-CM

## 2024-03-10 DIAGNOSIS — Z1159 Encounter for screening for other viral diseases: Secondary | ICD-10-CM | POA: Diagnosis not present

## 2024-03-10 NOTE — Patient Instructions (Signed)
 Wayne [name] , Thank you for taking time to come for your Medicare Wellness Visit. I appreciate your ongoing commitment to your health goals. Please review the following plan we discussed and let me know if I can assist you in the future.   Referrals/Orders/Follow-Ups/Clinician Recommendations: Aim for 30 minutes of exercise or brisk walking, 6-8 glasses of water, and 5 servings of fruits and vegetables each day. Educated and advised on getting the COVID, Pneumonia, and Shingles vaccines in 2025.   This is a list of the screening recommended for you and due dates:  Health Maintenance  Topic Date Due   Hepatitis C Screening  Never done   COVID-19 Vaccine (2 - Janssen risk series) 03/26/2024*   Zoster (Shingles) Vaccine (1 of 2) 06/10/2024*   Pneumonia Vaccine (1 of 2 - PCV) 03/10/2025*   Colon Cancer Screening  10/12/2024   Medicare Annual Wellness Visit  03/10/2025   DTaP/Tdap/Td vaccine (2 - Td or Tdap) 05/20/2029   HPV Vaccine  Aged Out   Meningitis B Vaccine  Aged Out   Flu Shot  Discontinued  *Topic was postponed. The date shown is not the original due date.    Advanced directives: (Provided) Advance directive discussed with you today. I have provided a copy for you to complete at home and have notarized. Once this is complete, please bring a copy in to our office so we can scan it into your chart.   Next Medicare Annual Wellness Visit scheduled for next year: Yes

## 2024-03-10 NOTE — Progress Notes (Addendum)
 Subjective:   Wayne [name] is a 68 y.o. who presents for a Medicare Wellness preventive visit.  Visit Complete: In person  Persons Participating in Visit: Patient.  AWV Questionnaire: Yes: Patient Medicare AWV questionnaire was completed by the patient on 03/09/2024; I have confirmed that all information answered by patient is correct and no changes since this date.  Cardiac Risk Factors include: advanced age (>27men, >14 women);dyslipidemia;male gender     Objective:    Today's Vitals   03/10/24 0929  BP: 120/68  Pulse: (!) 59  SpO2: 98%  Weight: 213 lb 12.8 oz (97 kg)  Height: 6' (1.829 m)   Body mass index is 29 kg/m.     03/10/2024    9:27 AM 09/21/2014    1:26 PM  Advanced Directives  Does Patient Have a Medical Advance Directive? No No  Would patient like information on creating a medical advance directive? Yes (MAU/Ambulatory/Procedural Areas - Information given)     Current Medications (verified) Outpatient Encounter Medications as of 03/10/2024  Medication Sig   amoxicillin (AMOXIL) 500 MG tablet    cholecalciferol (VITAMIN D3) 25 MCG (1000 UNIT) tablet Take 1,000 Units by mouth daily.   clotrimazole -betamethasone  (LOTRISONE ) cream Apply 1 Application topically daily.   hydrocortisone  (ANUSOL -HC) 25 MG suppository Place 1 suppository (25 mg total) rectally 2 (two) times daily.   hydrocortisone  2.5 % cream Apply topically 3 (three) times daily. As directed   ibuprofen  (ADVIL ) 600 MG tablet Take 1 tablet (600 mg total) by mouth every 6 (six) hours as needed for mild pain or moderate pain.   Multiple Vitamins-Minerals (ICAPS AREDS 2 PO)    Omega-3 Fatty Acids (FISH OIL ) 1000 MG CAPS Take 2 capsules (2,000 mg total) by mouth daily.   No facility-administered encounter medications on file as of 03/10/2024.    Allergies (verified) Patient has no known allergies.   History: Past Medical History:  Diagnosis Date   Arthritis    Hyperlipidemia    slightly  elevated, no meds   Varicose veins    Past Surgical History:  Procedure Laterality Date   CATARACT EXTRACTION Right    ENDOVENOUS ABLATION SAPHENOUS VEIN W/ LASER Left 08/10/2009   left greater saphenous vein   ENDOVENOUS ABLATION SAPHENOUS VEIN W/ LASER Left 10/08/2013   EVLA left proximal greater saphenous vein and stab phlebectomy 10-20 incisions left leg by Ouida Bloom MD   HEMORRHOID BANDING  01/14/2024   REPLACEMENT TOTAL HIP W/  RESURFACING IMPLANTS Left    VASECTOMY     Family History  Problem Relation Age of Onset   Diabetes Father    Hypertension Mother    Colon cancer Neg Hx    Esophageal cancer Neg Hx    Rectal cancer Neg Hx    Stomach cancer Neg Hx    Social History   Socioeconomic History   Marital status: Married    Spouse name: Not on file   Number of children: 2   Years of education: Not on file   Highest education level: 12th grade  Occupational History   Not on file  Tobacco Use   Smoking status: Never    Passive exposure: Never   Smokeless tobacco: Never  Vaping Use   Vaping status: Never Used  Substance and Sexual Activity   Alcohol use: Yes    Alcohol/week: 3.0 standard drinks of alcohol    Types: 3 Cans of beer per week   Drug use: No   Sexual activity: Yes  Other  Topics Concern   Not on file  Social History Narrative   Regular Exercise- no   Social Drivers of Health   Financial Resource Strain: Low Risk  (03/10/2024)   Overall Financial Resource Strain (CARDIA)    Difficulty of Paying Living Expenses: Not hard at all  Food Insecurity: No Food Insecurity (03/10/2024)   Hunger Vital Sign    Worried About Running Out of Food in the Last Year: Never true    Ran Out of Food in the Last Year: Never true  Transportation Needs: No Transportation Needs (03/10/2024)   PRAPARE - Administrator, Civil Service (Medical): No    Lack of Transportation (Non-Medical): No  Physical Activity: Sufficiently Active (03/10/2024)   Exercise Vital Sign     Days of Exercise per Week: 2 days    Minutes of Exercise per Session: 90 min  Stress: No Stress Concern Present (03/10/2024)   [employer] of Occupational Health - Occupational Stress Questionnaire    Feeling of Stress : Only a little  Social Connections: Socially Integrated (03/10/2024)   Social Connection and Isolation Panel [NHANES]    Frequency of Communication with Friends and Family: More than three times a week    Frequency of Social Gatherings with Friends and Family: Three times a week    Attends Religious Services: More than 4 times per year    Active Member of Clubs or Organizations: Yes    Attends Engineer, structural: More than 4 times per year    Marital Status: Married    Tobacco Counseling Counseling given: No    Clinical Intake:  Pre-visit preparation completed: Yes  Pain : No/denies pain     BMI - recorded: 29 Nutritional Risks: None Diabetes: No  No results found for: "HGBA1C"   How often do you need to have someone help you when you read instructions, pamphlets, or other written materials from your doctor or pharmacy?: 1 - Never  Interpreter Needed?: No  Information entered by :: Kandy Orris, CMA   Activities of Daily Living     03/10/2024    9:34 AM 03/09/2024   11:00 AM  In your present state of health, do you have any difficulty performing the following activities:  Hearing? 0 0  Vision? 0 0  Difficulty concentrating or making decisions? 0 0  Walking or climbing stairs? 0 0  Dressing or bathing? 0 0  Doing errands, shopping? 0 0  Preparing Food and eating ? N N  Using the Toilet? N N  In the past six months, have you accidently leaked urine? N N  Do you have problems with loss of bowel control? N N  Managing your Medications? N N  Managing your Finances? N N  Housekeeping or managing your Housekeeping? N N    Patient Care Team: Plotnikov, Oakley Bellman, MD as PCP - General Swinteck, Polly Brink, MD as Consulting Physician  (Orthopedic Surgery) [facility] (Ophthalmology)  Indicate any recent Medical Services you may have received from other than [facility] providers in the past year (date may be approximate).     Assessment:   This is a routine wellness examination for [employer].  Hearing/Vision screen Hearing Screening - Comments:: Denies hearing difficulties   Vision Screening - Comments:: Denies vision concerns - Dr Candi Chafe   Goals Addressed               This Visit's Progress     Weight (lb) < 200 lb (90.7 kg) (pt-stated)  213 lb 12.8 oz (97 kg)     Patient stated he plans to lose weight about 10-15lbs       Depression Screen     03/10/2024    9:40 AM 11/27/2023    7:59 AM 02/11/2023    9:16 AM 10/03/2022    2:32 PM 03/03/2021    2:50 PM 06/26/2017    8:29 AM  PHQ 2/9 Scores  PHQ - 2 Score 0 0 0 0 0 0  PHQ- 9 Score 0         Fall Risk     03/10/2024    9:37 AM 03/09/2024   11:00 AM 11/27/2023    7:59 AM 02/11/2023    9:16 AM 10/03/2022    2:32 PM  Fall Risk   Falls in the past year? 0 0 0 0 0  Number falls in past yr: 0  0 0 0  Injury with Fall? 0 0 0 0 0  Risk for fall due to : No Fall Risks  No Fall Risks History of fall(s) No Fall Risks  Follow up Falls prevention discussed;Falls evaluation completed  Falls evaluation completed Falls evaluation completed Falls evaluation completed    MEDICARE RISK AT HOME:  Medicare Risk at Home Any stairs in or around the home?: Yes If so, are there any without handrails?: No Home free of loose throw rugs in walkways, pet beds, electrical cords, etc?: Yes Adequate lighting in your home to reduce risk of falls?: Yes Life alert?: No Use of a cane, walker or w/c?: Yes Grab bars in the bathroom?: No Shower chair or bench in shower?: Yes Elevated toilet seat or a handicapped toilet?: No  TIMED UP AND GO:  Was the test performed?  No  Cognitive Function: 6CIT completed        03/10/2024    9:54 AM  6CIT Screen  What Year? 0 points   What month? 0 points  What time? 0 points  Count back from 20 0 points  Months in reverse 0 points  Repeat phrase 2 points  Total Score 2 points    Immunizations Immunization History  Administered Date(s) Administered   Janssen (J&J) SARS-COV-2 Vaccination 01/04/2020   Tdap 05/21/2019    Screening Tests Health Maintenance  Topic Date Due   Hepatitis C Screening  Never done   COVID-19 Vaccine (2 - Janssen risk series) 03/26/2024 (Originally 02/01/2020)   Zoster Vaccines- Shingrix (1 of 2) 06/10/2024 (Originally 01/12/1975)   Pneumonia Vaccine 21+ Years old (1 of 2 - PCV) 03/10/2025 (Originally 01/12/1975)   Colonoscopy  10/12/2024   Medicare Annual Wellness (AWV)  03/10/2025   DTaP/Tdap/Td (2 - Td or Tdap) 05/20/2029   HPV VACCINES  Aged Out   Meningococcal B Vaccine  Aged Out   INFLUENZA VACCINE  Discontinued    Health Maintenance  Health Maintenance Due  Topic Date Due   Hepatitis C Screening  Never done   Health Maintenance Items Addressed: Hepatitis C Screening ordered today  Additional Screening:  Vision Screening: Recommended annual ophthalmology exams for early detection of glaucoma and other disorders of the eye.  Dental Screening: Recommended annual dental exams for proper oral hygiene  Community Resource Referral / Chronic Care Management: CRR required this visit?  No   CCM required this visit?  No     Plan:     I have personally reviewed and noted the following in the patient's chart:   Medical and social history Use of alcohol, tobacco or illicit  drugs  Current medications and supplements including opioid prescriptions. Patient is not currently taking opioid prescriptions. Functional ability and status Nutritional status Physical activity Advanced directives List of other physicians Hospitalizations, surgeries, and ER visits in previous 12 months Vitals Screenings to include cognitive, depression, and falls Referrals and appointments  In  addition, I have reviewed and discussed with patient certain preventive protocols, quality metrics, and best practice recommendations. A written personalized care plan for preventive services as well as general preventive health recommendations were provided to patient.     Patria Bookbinder, CMA   03/10/2024   After Visit Summary: (In Person-Declined) Patient declined AVS at this time.  Notes: Pt stated he has moved to [place], [place] and plans to change providers (also will have a new insurance plan for 2026).  Medical screening examination/treatment/procedure(s) were performed by non-physician practitioner and as supervising physician I was immediately available for consultation/collaboration.  I agree with above. Adelaide Holy, MD

## 2024-03-10 NOTE — Patient Instructions (Signed)
     Make an appointment for sports medicine for Right shoulder injury - possible rotator cuff injury - pickleball    Medications changes include :   None    A referral was ordered sports medicine and someone will call you to schedule an appointment.

## 2024-03-10 NOTE — Progress Notes (Signed)
    Subjective:    Patient ID: Wayne [name], male    DOB: [birth year], 68 y.o.   MRN: [id number]      HPI Felimon is here for  Chief Complaint  Patient presents with   Arm Pain    Patient reports pain in right arm after playing pickle-ball last week     Last week he was playing pickle ball that he must of swung incorrectly because he has had right upper arm/shoulder pain since then.  He has also had some inability to lift the arm without assistance from the other arm.  When this did happen he did hear a pop.  He denies any swelling or bruising.  He has been taking ibuprofen  which has been helping.      Medications and allergies reviewed with patient and updated if appropriate.  Current Outpatient Medications on File Prior to Visit  Medication Sig Dispense Refill   amoxicillin (AMOXIL) 500 MG tablet      cholecalciferol (VITAMIN D3) 25 MCG (1000 UNIT) tablet Take 1,000 Units by mouth daily.     clotrimazole -betamethasone  (LOTRISONE ) cream Apply 1 Application topically daily. 45 g 2   hydrocortisone  (ANUSOL -HC) 25 MG suppository Place 1 suppository (25 mg total) rectally 2 (two) times daily. 24 suppository 35   hydrocortisone  2.5 % cream Apply topically 3 (three) times daily. As directed 60 g 2   ibuprofen  (ADVIL ) 600 MG tablet Take 1 tablet (600 mg total) by mouth every 6 (six) hours as needed for mild pain or moderate pain. 30 tablet 0   Multiple Vitamins-Minerals (ICAPS AREDS 2 PO)      Omega-3 Fatty Acids (FISH OIL ) 1000 MG CAPS Take 2 capsules (2,000 mg total) by mouth daily. 100 capsule 3   No current facility-administered medications on file prior to visit.    Review of Systems     Objective:   Vitals:   03/10/24 1122  BP: 120/68  Pulse: (!) 59  SpO2: 98%   BP Readings from Last 3 Encounters:  03/10/24 120/68  03/10/24 120/68  01/14/24 122/60   Wt Readings from Last 3 Encounters:  03/10/24 213 lb (96.6 kg)  03/10/24 213 lb 12.8 oz (97 kg)  01/14/24 222 lb  (100.7 kg)   Body mass index is 28.89 kg/m.    Physical Exam Constitutional:      General: He is not in acute distress.    Appearance: Normal appearance. He is not ill-appearing.  HENT:     Head: Normocephalic and atraumatic.  Musculoskeletal:     Comments: Right shoulder without any obvious swelling or effusion.  There is no warmth, bruising or deformity.  Slight tenderness anterior shoulder.  Decreased range of motion-can only lift arm up in certain positions with help from his other arm.  Normal sensation, radial pulse.     Skin:    General: Skin is warm and dry.     Findings: No bruising or erythema.            Assessment & Plan:   Right shoulder pain/injury: Acute Occurred 1 week while playing pickle ball when he swung wrong He did hear a pop at that time Has weakness in the arm and not able to lift it without help from his other arm in certain positions Has pain with certain movements in the shoulder and upper arm No numbness or tingling Probable rotator cuff injury Continue ibuprofen , ice, heat and revision of activities Referral to sports medicine

## 2024-03-12 LAB — HEPATITIS C ANTIBODY: Hepatitis C Ab: NONREACTIVE

## 2024-03-13 NOTE — Progress Notes (Unsigned)
   Joanna Muck, PhD, LAT, ATC acting as a scribe for Garlan Juniper, MD.  Wayne [name] is a 68 y.o. male who presents to [facility] at [facility] today for R shoulder pain x last week. He noticed the pain after playing pickleball. Pt locates pain to ***  Radiates: Aggravates: Treatments tried: IBU  Pertinent review of systems: ***  Relevant historical information: ***   Exam:  There were no vitals taken for this visit. General: Well Developed, well nourished, and in no acute distress.   MSK: ***    Lab and Radiology Results No results found for this or any previous visit (from the past 72 hours). No results found.     Assessment and Plan: 68 y.o. male with ***   PDMP not reviewed this encounter. No orders of the defined types were placed in this encounter.  No orders of the defined types were placed in this encounter.    Discussed warning signs or symptoms. Please see discharge instructions. Patient expresses understanding.   ***

## 2024-03-16 ENCOUNTER — Ambulatory Visit: Admitting: Family Medicine

## 2024-03-16 ENCOUNTER — Ambulatory Visit (INDEPENDENT_AMBULATORY_CARE_PROVIDER_SITE_OTHER)

## 2024-03-16 ENCOUNTER — Encounter: Payer: Self-pay | Admitting: Family Medicine

## 2024-03-16 ENCOUNTER — Other Ambulatory Visit: Payer: Self-pay

## 2024-03-16 VITALS — BP 122/82 | HR 64 | Ht 72.0 in | Wt 218.0 lb

## 2024-03-16 DIAGNOSIS — M19011 Primary osteoarthritis, right shoulder: Secondary | ICD-10-CM | POA: Diagnosis not present

## 2024-03-16 DIAGNOSIS — M25511 Pain in right shoulder: Secondary | ICD-10-CM

## 2024-03-16 DIAGNOSIS — G8929 Other chronic pain: Secondary | ICD-10-CM

## 2024-03-16 NOTE — Patient Instructions (Signed)
 Thank you for coming in today.   Please get an Xray today before you leave   We can scheduled for an injection for your shoulder at any time.   A referral for physical therapy has been submitted. A representative from the physical therapy office will contact you to coordinate scheduling after confirming your benefits with your insurance provider. If you do not hear from the physical therapy office within the next 1-2 weeks, please let us  know.

## 2024-03-18 ENCOUNTER — Ambulatory Visit: Payer: Self-pay | Admitting: Family Medicine

## 2024-03-18 NOTE — Progress Notes (Signed)
Right shoulder x-ray shows mild arthritis

## 2024-03-23 ENCOUNTER — Telehealth: Payer: Self-pay

## 2024-03-23 DIAGNOSIS — K649 Unspecified hemorrhoids: Secondary | ICD-10-CM

## 2024-03-23 NOTE — Telephone Encounter (Signed)
 Copied from CRM [id number]. Topic: General - Other >> Mar 20, 2024  1:59 PM [place] J wrote: Reason for CRM: Sherline Distel from [facility] is calling in regards to a claim from an office visit on 12/13/2023. She's stating that it cannot be processed due to needing a referral from Dr. Georgia Kipper.  Fax Provided: [phone]

## 2024-03-26 NOTE — Telephone Encounter (Signed)
 Okay.  Thanks.

## 2024-04-02 ENCOUNTER — Ambulatory Visit: Admitting: Internal Medicine

## 2024-04-02 ENCOUNTER — Encounter: Payer: Self-pay | Admitting: Internal Medicine

## 2024-04-02 VITALS — BP 118/66 | HR 56 | Temp 97.8°F | Ht 72.0 in | Wt 214.2 lb

## 2024-04-02 DIAGNOSIS — R350 Frequency of micturition: Secondary | ICD-10-CM

## 2024-04-02 DIAGNOSIS — Z125 Encounter for screening for malignant neoplasm of prostate: Secondary | ICD-10-CM | POA: Diagnosis not present

## 2024-04-02 DIAGNOSIS — G8929 Other chronic pain: Secondary | ICD-10-CM

## 2024-04-02 DIAGNOSIS — E785 Hyperlipidemia, unspecified: Secondary | ICD-10-CM | POA: Diagnosis not present

## 2024-04-02 DIAGNOSIS — Z0000 Encounter for general adult medical examination without abnormal findings: Secondary | ICD-10-CM | POA: Diagnosis not present

## 2024-04-02 DIAGNOSIS — M25511 Pain in right shoulder: Secondary | ICD-10-CM

## 2024-04-02 DIAGNOSIS — I2583 Coronary atherosclerosis due to lipid rich plaque: Secondary | ICD-10-CM

## 2024-04-02 DIAGNOSIS — N401 Enlarged prostate with lower urinary tract symptoms: Secondary | ICD-10-CM | POA: Diagnosis not present

## 2024-04-02 LAB — URINALYSIS, ROUTINE W REFLEX MICROSCOPIC
Bilirubin Urine: NEGATIVE
Hgb urine dipstick: NEGATIVE
Ketones, ur: NEGATIVE
Nitrite: NEGATIVE
RBC / HPF: NONE SEEN (ref 0–?)
Specific Gravity, Urine: 1.01 (ref 1.000–1.030)
Total Protein, Urine: NEGATIVE
Urine Glucose: NEGATIVE
Urobilinogen, UA: 0.2 (ref 0.0–1.0)
pH: 7 (ref 5.0–8.0)

## 2024-04-02 LAB — CBC WITH DIFFERENTIAL/PLATELET
Basophils Absolute: 0.1 10*3/uL (ref 0.0–0.1)
Basophils Relative: 1 % (ref 0.0–3.0)
Eosinophils Absolute: 0.2 10*3/uL (ref 0.0–0.7)
Eosinophils Relative: 3.2 % (ref 0.0–5.0)
HCT: 40 % (ref 39.0–52.0)
Hemoglobin: 13.4 g/dL (ref 13.0–17.0)
Lymphocytes Relative: 22.1 % (ref 12.0–46.0)
Lymphs Abs: 1.5 10*3/uL (ref 0.7–4.0)
MCHC: 33.5 g/dL (ref 30.0–36.0)
MCV: 90 fl (ref 78.0–100.0)
Monocytes Absolute: 0.7 10*3/uL (ref 0.1–1.0)
Monocytes Relative: 10.2 % (ref 3.0–12.0)
Neutro Abs: 4.2 10*3/uL (ref 1.4–7.7)
Neutrophils Relative %: 63.5 % (ref 43.0–77.0)
Platelets: 234 10*3/uL (ref 150.0–400.0)
RBC: 4.44 Mil/uL (ref 4.22–5.81)
RDW: 12.9 % (ref 11.5–15.5)
WBC: 6.7 10*3/uL (ref 4.0–10.5)

## 2024-04-02 LAB — COMPREHENSIVE METABOLIC PANEL WITH GFR
ALT: 17 U/L (ref 0–53)
AST: 21 U/L (ref 0–37)
Albumin: 4.7 g/dL (ref 3.5–5.2)
Alkaline Phosphatase: 60 U/L (ref 39–117)
BUN: 14 mg/dL (ref 6–23)
CO2: 30 meq/L (ref 19–32)
Calcium: 10.2 mg/dL (ref 8.4–10.5)
Chloride: 101 meq/L (ref 96–112)
Creatinine, Ser: 0.82 mg/dL (ref 0.40–1.50)
GFR: 91.72 mL/min (ref >60.00)
Glucose, Bld: 88 mg/dL (ref 70–99)
Potassium: 4.4 meq/L (ref 3.5–5.1)
Sodium: 138 meq/L (ref 135–145)
Total Bilirubin: 0.6 mg/dL (ref 0.2–1.2)
Total Protein: 6.8 g/dL (ref 6.0–8.3)

## 2024-04-02 LAB — LIPID PANEL
Cholesterol: 236 mg/dL — ABNORMAL HIGH (ref 0–200)
HDL: 53.8 mg/dL (ref >39.00)
LDL Cholesterol: 151 mg/dL — ABNORMAL HIGH (ref 0–99)
NonHDL: 182.5
Total CHOL/HDL Ratio: 4
Triglycerides: 156 mg/dL — ABNORMAL HIGH (ref 0.0–149.0)
VLDL: 31.2 mg/dL (ref 0.0–40.0)

## 2024-04-02 LAB — TSH: TSH: 1.98 u[IU]/mL (ref 0.35–5.50)

## 2024-04-02 LAB — PSA: PSA: 2.52 ng/mL (ref 0.10–4.00)

## 2024-04-02 NOTE — Assessment & Plan Note (Signed)
 Take fish oil  Labs

## 2024-04-02 NOTE — Assessment & Plan Note (Signed)
 Check PSA. ?

## 2024-04-02 NOTE — Progress Notes (Signed)
 Subjective:  Patient ID: Wayne [name], male    DOB: 1956/03/11  Age: 68 y.o. MRN: [id number]  CC: No chief complaint on file.   HPI CABE LASHLEY presents for well exam  Outpatient Medications Prior to Visit  Medication Sig Dispense Refill   cholecalciferol (VITAMIN D3) 25 MCG (1000 UNIT) tablet Take 1,000 Units by mouth daily.     clotrimazole -betamethasone  (LOTRISONE ) cream Apply 1 Application topically daily. 45 g 2   hydrocortisone  (ANUSOL -HC) 25 MG suppository Place 1 suppository (25 mg total) rectally 2 (two) times daily. 24 suppository 35   hydrocortisone  2.5 % cream Apply topically 3 (three) times daily. As directed 60 g 2   ibuprofen  (ADVIL ) 600 MG tablet Take 1 tablet (600 mg total) by mouth every 6 (six) hours as needed for mild pain or moderate pain. 30 tablet 0   Multiple Vitamins-Minerals (ICAPS AREDS 2 PO)      Omega-3 Fatty Acids (FISH OIL ) 1000 MG CAPS Take 2 capsules (2,000 mg total) by mouth daily. 100 capsule 3   amoxicillin (AMOXIL) 500 MG tablet  (Patient not taking: Reported on 04/02/2024)     No facility-administered medications prior to visit.    ROS: Review of Systems  Constitutional:  Negative for appetite change, fatigue and unexpected weight change.  HENT:  Negative for congestion, nosebleeds, sneezing, sore throat and trouble swallowing.   Eyes:  Negative for itching and visual disturbance.  Respiratory:  Negative for cough.   Cardiovascular:  Negative for chest pain, palpitations and leg swelling.  Gastrointestinal:  Negative for abdominal distention, blood in stool, diarrhea and nausea.  Genitourinary:  Negative for frequency and hematuria.  Musculoskeletal:  Negative for back pain, gait problem, joint swelling and neck pain.  Skin:  Negative for rash.  Neurological:  Negative for dizziness, tremors, speech difficulty and weakness.  Psychiatric/Behavioral:  Negative for agitation, dysphoric mood and sleep disturbance. The patient is not  nervous/anxious.     Objective:  BP 118/66   Pulse (!) 56   Temp 97.8 F (36.6 C)   Ht 6' (1.829 m)   Wt 214 lb 3.2 oz (97.2 kg)   SpO2 99%   BMI 29.05 kg/m   BP Readings from Last 3 Encounters:  04/02/24 118/66  03/16/24 122/82  03/10/24 120/68    Wt Readings from Last 3 Encounters:  04/02/24 214 lb 3.2 oz (97.2 kg)  03/16/24 218 lb (98.9 kg)  03/10/24 213 lb (96.6 kg)    Physical Exam Constitutional:      General: He is not in acute distress.    Appearance: He is well-developed.     Comments: NAD  Eyes:     Conjunctiva/sclera: Conjunctivae normal.     Pupils: Pupils are equal, round, and reactive to light.  Neck:     Thyroid : No thyromegaly.     Vascular: No JVD.  Cardiovascular:     Rate and Rhythm: Normal rate and regular rhythm.     Heart sounds: Normal heart sounds. No murmur heard.    No friction rub. No gallop.  Pulmonary:     Effort: Pulmonary effort is normal. No respiratory distress.     Breath sounds: Normal breath sounds. No wheezing or rales.  Chest:     Chest wall: No tenderness.  Abdominal:     General: Bowel sounds are normal. There is no distension.     Palpations: Abdomen is soft. There is no mass.     Tenderness: There is no abdominal tenderness.  There is no guarding or rebound.  Musculoskeletal:        General: No tenderness. Normal range of motion.     Cervical back: Normal range of motion.  Lymphadenopathy:     Cervical: No cervical adenopathy.  Skin:    General: Skin is warm and dry.     Findings: No rash.  Neurological:     Mental Status: He is alert and oriented to person, place, and time.     Cranial Nerves: No cranial nerve deficit.     Motor: No abnormal muscle tone.     Coordination: Coordination normal.     Gait: Gait normal.     Deep Tendon Reflexes: Reflexes are normal and symmetric.  Psychiatric:        Behavior: Behavior normal.        Thought Content: Thought content normal.        Judgment: Judgment normal.    Right shoulder-pain with range of motion  Procedure: EKG Indication: welcome to [facility] Impression: S brady. No acute changes.   Lab Results  Component Value Date   WBC 6.7 04/02/2024   HGB 13.4 04/02/2024   HCT 40.0 04/02/2024   PLT 234.0 04/02/2024   GLUCOSE 88 04/02/2024   CHOL 236 (H) 04/02/2024   TRIG 156.0 (H) 04/02/2024   HDL 53.80 04/02/2024   LDLDIRECT 155.3 05/05/2013   LDLCALC 151 (H) 04/02/2024   ALT 17 04/02/2024   AST 21 04/02/2024   NA 138 04/02/2024   K 4.4 04/02/2024   CL 101 04/02/2024   CREATININE 0.82 04/02/2024   BUN 14 04/02/2024   CO2 30 04/02/2024   TSH 1.98 04/02/2024   PSA 2.52 04/02/2024    CT CARDIAC SCORING (SELF PAY ONLY) Addendum Date: 10/18/2021 ADDENDUM REPORT: 10/18/2021 17:50 CLINICAL DATA:  Cardiovascular Disease Risk stratification EXAM: Coronary Calcium Score TECHNIQUE: A gated, non-contrast computed tomography scan of the heart was performed using 3mm slice thickness. Axial images were analyzed on a dedicated workstation. Calcium scoring of the coronary arteries was performed using the Agatston method. FINDINGS: Coronary arteries: Normal origins. Coronary Calcium Score: Left main: 0 Left anterior descending artery: 0 Left circumflex artery: 0 Right coronary artery: 3.05 Total: 3.05 Percentile: 27th Pericardium: Normal. Ascending Aorta: Aneurysmal aorta to 40 mm at the level of the main PA bifurcation. Non-cardiac: See separate report from [facility]. IMPRESSION: 1. Coronary calcium score of 3.05. This was 27th percentile for age-, race-, and sex-matched controls. 2. Ascending Aorta: Aneurysmal aorta to 40 mm at the level of the main PA bifurcation. 3. Aortic valve calcifications. RECOMMENDATIONS: Coronary artery calcium (CAC) score is a strong predictor of incident coronary heart disease (CHD) and provides predictive information beyond traditional risk factors. CAC scoring is reasonable to use in the decision to withhold, postpone, or  initiate statin therapy in intermediate-risk or selected borderline-risk asymptomatic adults (age 79-75 years and LDL-C >=70 to <190 mg/dL) who do not have diabetes or established atherosclerotic cardiovascular disease (ASCVD).* In intermediate-risk (10-year ASCVD risk >=7.5% to <20%) adults or selected borderline-risk (10-year ASCVD risk >=5% to <7.5%) adults in whom a CAC score is measured for the purpose of making a treatment decision the following recommendations have been made: If CAC=0, it is reasonable to withhold statin therapy and reassess in 5 to 10 years, as long as higher risk conditions are absent (diabetes mellitus, family history of premature CHD in first degree relatives (males <55 years; females <65 years), cigarette smoking, or LDL >=190 mg/dL). If CAC is  1 to 99, it is reasonable to initiate statin therapy for patients >=10 years of age. If CAC is >=100 or >=75th percentile, it is reasonable to initiate statin therapy at any age. Cardiology referral should be considered for patients with CAC scores >=400 or >=75th percentile. *2018 AHA/ACC/AACVPR/AAPA/ABC/ACPM/ADA/AGS/APhA/ASPC/NLA/PCNA Guideline on the Management of Blood Cholesterol: A Report of the American College of Cardiology/American Heart Association Task Force on Clinical Practice Guidelines. J Am Coll Cardiol. 2019;73(24):3168-3209. Dinah Franco, MD Electronically Signed   By: Hazle Lites M.D.   On: 10/18/2021 17:50   Result Date: 10/18/2021 EXAM: OVER-READ INTERPRETATION  CT CHEST The following report is an over-read performed by radiologist Dr. Janeece Mechanic of [facility] on 10/18/2021. This over-read does not include interpretation of cardiac or coronary anatomy or pathology. The dyslipidemia interpretation by the cardiologist is attached. COMPARISON:  None. FINDINGS: Vascular: Mild aneurysmal dilatation of the ascending thoracic aorta, 4.1 cm. Heart is normal size. Mediastinum/Nodes: No adenopathy Lungs/Pleura:  Lungs clear.  No effusions. Upper Abdomen: Imaging into the upper abdomen demonstrates no acute findings. Musculoskeletal: Chest wall soft tissues are unremarkable. No acute bony abnormality. IMPRESSION: 4.1 cm ascending thoracic aortic aneurysm. Recommend annual imaging followup by CTA or MRA. This recommendation follows 2010 ACCF/AHA/AATS/ACR/ASA/SCA/SCAI/SIR/STS/SVM Guidelines for the Diagnosis and Management of Patients with Thoracic Aortic Disease. Circulation. 2010; 121: Z610-R604. Aortic aneurysm NOS (ICD10-I71.9) Electronically Signed: By: Janeece Mechanic M.D. On: 10/18/2021 15:38    Assessment & Plan:   Problem List Items Addressed This Visit     Well adult exam   The patient is here for annual Medicare wellness examination and management of other chronic and acute problems.   The risk factors are reflected in the social history.  The roster of all physicians providing medical care to patient - is listed in the Snapshot section of the chart.  Activities of daily living:  The patient is 100% inedpendent in all ADLs: dressing, toileting, feeding as well as independent mobility  Home safety : good. Using seatbelts. There is no violence in the home.   There is no risks for hepatitis, STDs or HIV. There is no history of blood transfusion. They have no travel history to infectious disease endemic areas of the world.  The patient has  seen their dentist in the last 12 month. They have  seen their eye doctor in the last year. They deny  Any major hearing difficulty and have not had audiologic testing in the last year.  They do not  have excessive sun exposure. Discussed the need for sun protection: hats, long sleeves and use of sunscreen if there is significant sun exposure.   Diet: the importance of a healthy diet is discussed. They do have a reasonably healthy  diet.  The patient has a fairly regular exercise program of a mixed nature: walking, yard work, etc.The benefits of regular aerobic  exercise were discussed.  Depression screen: there are no signs or vegative symptoms of depression- irritability, change in appetite, anhedonia, sadness/tearfullness.  Cognitive assessment: the patient manages all their financial and personal affairs and is actively engaged. They could relate day,date,year and events; recalled 3/3 objects at 3 minutes  The following portions of the patient's history were reviewed and updated as appropriate: allergies, current medications, past family history, past medical history,  past surgical history, past social history  and problem list.  Vision, hearing, body mass index were assessed and reviewed.   During the course of the visit the patient was educated and  counseled about appropriate screening and preventive services including : fall prevention , diabetes screening, nutrition counseling, colorectal cancer screening, and recommended immunizations.   We discussed age appropriate health related issues, including available/recomended screening tests and vaccinations. We discussed a need for adhering to healthy diet and exercise. Labs/EKG were reviewed/ordered. All questions were answered. Form filled out. 2022 Coronary calcium CT score of 3.05  Colon due in 2025  tDAP 2020 flu shot declined Shingrix declined      Dyslipidemia   Labs      Relevant Orders   TSH (Completed)   Lipid panel (Completed)   Comprehensive metabolic panel with GFR (Completed)   Coronary atherosclerosis   Take fish oil  Labs      Relevant Orders   CBC with Differential/Platelet (Completed)   Comprehensive metabolic panel with GFR (Completed)   Medicare annual wellness visit, initial - Primary   Relevant Orders   EKG 12-Lead (Completed)   TSH (Completed)   Urinalysis   CBC with Differential/Platelet (Completed)   Lipid panel (Completed)   PSA (Completed)   Comprehensive metabolic panel with GFR (Completed)   BPH (benign prostatic hyperplasia)   Check PSA       Relevant Orders   PSA (Completed)   Shoulder pain   Right shoulder now playing pickle ball with the left hand Follow-up with sports medicine         No orders of the defined types were placed in this encounter.     Follow-up: Return in about 1 year (around 04/02/2025) for a follow-up visit.  Anitra Barn, MD

## 2024-04-02 NOTE — Assessment & Plan Note (Signed)
 Labs

## 2024-04-02 NOTE — Patient Instructions (Signed)
 Infrared heat Blue-Emu cream use 2-3 times a day

## 2024-04-03 DIAGNOSIS — M25511 Pain in right shoulder: Secondary | ICD-10-CM | POA: Diagnosis not present

## 2024-04-03 DIAGNOSIS — G8929 Other chronic pain: Secondary | ICD-10-CM | POA: Diagnosis not present

## 2024-04-06 ENCOUNTER — Ambulatory Visit: Payer: Self-pay | Admitting: Internal Medicine

## 2024-04-06 DIAGNOSIS — M25519 Pain in unspecified shoulder: Secondary | ICD-10-CM | POA: Insufficient documentation

## 2024-04-06 DIAGNOSIS — N401 Enlarged prostate with lower urinary tract symptoms: Secondary | ICD-10-CM

## 2024-04-06 NOTE — Assessment & Plan Note (Signed)
 The patient is here for annual Medicare wellness examination and management of other chronic and acute problems.   The risk factors are reflected in the social history.  The roster of all physicians providing medical care to patient - is listed in the Snapshot section of the chart.  Activities of daily living:  The patient is 100% inedpendent in all ADLs: dressing, toileting, feeding as well as independent mobility  Home safety : good. Using seatbelts. There is no violence in the home.   There is no risks for hepatitis, STDs or HIV. There is no history of blood transfusion. They have no travel history to infectious disease endemic areas of the world.  The patient has  seen their dentist in the last 12 month. They have  seen their eye doctor in the last year. They deny  Any major hearing difficulty and have not had audiologic testing in the last year.  They do not  have excessive sun exposure. Discussed the need for sun protection: hats, long sleeves and use of sunscreen if there is significant sun exposure.   Diet: the importance of a healthy diet is discussed. They do have a reasonably healthy  diet.  The patient has a fairly regular exercise program of a mixed nature: walking, yard work, etc.The benefits of regular aerobic exercise were discussed.  Depression screen: there are no signs or vegative symptoms of depression- irritability, change in appetite, anhedonia, sadness/tearfullness.  Cognitive assessment: the patient manages all their financial and personal affairs and is actively engaged. They could relate day,date,year and events; recalled 3/3 objects at 3 minutes  The following portions of the patient's history were reviewed and updated as appropriate: allergies, current medications, past family history, past medical history,  past surgical history, past social history  and problem list.  Vision, hearing, body mass index were assessed and reviewed.   During the course of the visit  the patient was educated and counseled about appropriate screening and preventive services including : fall prevention , diabetes screening, nutrition counseling, colorectal cancer screening, and recommended immunizations.   We discussed age appropriate health related issues, including available/recomended screening tests and vaccinations. We discussed a need for adhering to healthy diet and exercise. Labs/EKG were reviewed/ordered. All questions were answered. Form filled out. 2022 Coronary calcium CT score of 3.05  Colon due in 2025  tDAP 2020 flu shot declined Shingrix declined

## 2024-04-06 NOTE — Assessment & Plan Note (Signed)
 Right shoulder now playing pickle ball with the left hand Follow-up with sports medicine

## 2024-04-07 DIAGNOSIS — M25511 Pain in right shoulder: Secondary | ICD-10-CM | POA: Diagnosis not present

## 2024-04-07 DIAGNOSIS — G8929 Other chronic pain: Secondary | ICD-10-CM | POA: Diagnosis not present

## 2024-04-08 NOTE — Telephone Encounter (Signed)
 Discussed with patient during their last visit with Dr.Plotnikov

## 2024-04-14 DIAGNOSIS — G8929 Other chronic pain: Secondary | ICD-10-CM | POA: Diagnosis not present

## 2024-04-14 DIAGNOSIS — M25511 Pain in right shoulder: Secondary | ICD-10-CM | POA: Diagnosis not present

## 2024-04-28 DIAGNOSIS — G8929 Other chronic pain: Secondary | ICD-10-CM | POA: Diagnosis not present

## 2024-04-28 DIAGNOSIS — M25511 Pain in right shoulder: Secondary | ICD-10-CM | POA: Diagnosis not present

## 2024-05-05 DIAGNOSIS — M25511 Pain in right shoulder: Secondary | ICD-10-CM | POA: Diagnosis not present

## 2024-05-05 DIAGNOSIS — G8929 Other chronic pain: Secondary | ICD-10-CM | POA: Diagnosis not present

## 2024-05-19 DIAGNOSIS — M25511 Pain in right shoulder: Secondary | ICD-10-CM | POA: Diagnosis not present

## 2024-05-19 DIAGNOSIS — G8929 Other chronic pain: Secondary | ICD-10-CM | POA: Diagnosis not present

## 2024-05-26 DIAGNOSIS — G8929 Other chronic pain: Secondary | ICD-10-CM | POA: Diagnosis not present

## 2024-05-26 DIAGNOSIS — M25511 Pain in right shoulder: Secondary | ICD-10-CM | POA: Diagnosis not present

## 2024-06-02 NOTE — Progress Notes (Unsigned)
   LILLETTE Ileana Collet, PhD, LAT, ATC acting as a scribe for Artist Lloyd, MD.  Wayne [name] is a 68 y.o. male who presents to [facility] at [facility] today for f/u R shoulder pain. Pt was last seen by Dr. Lloyd on 03/16/24 and was referred to [facility] PT in [place].  Today, pt reports ***  Dx imaging: 03/16/24 R shoulder XR  Pertinent review of systems: ***  Relevant historical information: ***   Exam:  There were no vitals taken for this visit. General: Well Developed, well nourished, and in no acute distress.   MSK: ***    Lab and Radiology Results No results found for this or any previous visit (from the past 72 hours). No results found.     Assessment and Plan: 68 y.o. male with ***   PDMP not reviewed this encounter. No orders of the defined types were placed in this encounter.  No orders of the defined types were placed in this encounter.    Discussed warning signs or symptoms. Please see discharge instructions. Patient expresses understanding.   ***

## 2024-06-03 ENCOUNTER — Other Ambulatory Visit: Payer: Self-pay

## 2024-06-03 ENCOUNTER — Ambulatory Visit: Admitting: Family Medicine

## 2024-06-03 ENCOUNTER — Encounter: Payer: Self-pay | Admitting: Family Medicine

## 2024-06-03 VITALS — BP 126/80 | HR 66 | Ht 72.0 in

## 2024-06-03 DIAGNOSIS — G8929 Other chronic pain: Secondary | ICD-10-CM | POA: Diagnosis not present

## 2024-06-03 DIAGNOSIS — M25511 Pain in right shoulder: Secondary | ICD-10-CM | POA: Diagnosis not present

## 2024-06-03 NOTE — Patient Instructions (Addendum)
 Thank you for coming in today.   Continue PT for 1 more month, if no improvement can order MRI.   See you back as needed.

## 2024-06-09 DIAGNOSIS — G8929 Other chronic pain: Secondary | ICD-10-CM | POA: Diagnosis not present

## 2024-06-09 DIAGNOSIS — M25511 Pain in right shoulder: Secondary | ICD-10-CM | POA: Diagnosis not present

## 2024-06-15 DIAGNOSIS — M25511 Pain in right shoulder: Secondary | ICD-10-CM | POA: Diagnosis not present

## 2024-06-15 DIAGNOSIS — G8929 Other chronic pain: Secondary | ICD-10-CM | POA: Diagnosis not present

## 2024-06-23 DIAGNOSIS — M25511 Pain in right shoulder: Secondary | ICD-10-CM | POA: Diagnosis not present

## 2024-06-23 DIAGNOSIS — G8929 Other chronic pain: Secondary | ICD-10-CM | POA: Diagnosis not present

## 2024-07-07 DIAGNOSIS — M25511 Pain in right shoulder: Secondary | ICD-10-CM | POA: Diagnosis not present

## 2024-07-07 DIAGNOSIS — G8929 Other chronic pain: Secondary | ICD-10-CM | POA: Diagnosis not present

## 2024-07-14 DIAGNOSIS — M25511 Pain in right shoulder: Secondary | ICD-10-CM | POA: Diagnosis not present

## 2024-07-14 DIAGNOSIS — G8929 Other chronic pain: Secondary | ICD-10-CM | POA: Diagnosis not present

## 2024-07-16 DIAGNOSIS — Z961 Presence of intraocular lens: Secondary | ICD-10-CM | POA: Diagnosis not present

## 2024-07-16 DIAGNOSIS — H353131 Nonexudative age-related macular degeneration, bilateral, early dry stage: Secondary | ICD-10-CM | POA: Diagnosis not present

## 2024-08-11 DIAGNOSIS — G8929 Other chronic pain: Secondary | ICD-10-CM | POA: Diagnosis not present

## 2024-08-11 DIAGNOSIS — M25511 Pain in right shoulder: Secondary | ICD-10-CM | POA: Diagnosis not present

## 2024-08-13 DIAGNOSIS — H35363 Drusen (degenerative) of macula, bilateral: Secondary | ICD-10-CM | POA: Diagnosis not present

## 2024-08-13 DIAGNOSIS — H353132 Nonexudative age-related macular degeneration, bilateral, intermediate dry stage: Secondary | ICD-10-CM | POA: Diagnosis not present

## 2024-08-13 DIAGNOSIS — H26493 Other secondary cataract, bilateral: Secondary | ICD-10-CM | POA: Diagnosis not present

## 2024-09-24 ENCOUNTER — Other Ambulatory Visit (INDEPENDENT_AMBULATORY_CARE_PROVIDER_SITE_OTHER)

## 2024-09-24 DIAGNOSIS — N401 Enlarged prostate with lower urinary tract symptoms: Secondary | ICD-10-CM

## 2024-09-24 DIAGNOSIS — R350 Frequency of micturition: Secondary | ICD-10-CM | POA: Diagnosis not present

## 2024-09-28 ENCOUNTER — Ambulatory Visit: Payer: Self-pay | Admitting: Internal Medicine

## 2024-09-28 LAB — PSA, TOTAL AND FREE
PSA, % Free: 40 % (ref >25)
PSA, Free: 0.2 ng/mL
PSA, Total: 0.5 ng/mL (ref <=4.0)

## 2024-10-06 ENCOUNTER — Telehealth: Payer: Self-pay

## 2024-10-06 DIAGNOSIS — Z1211 Encounter for screening for malignant neoplasm of colon: Secondary | ICD-10-CM

## 2024-10-06 NOTE — Telephone Encounter (Signed)
 Copied from CRM #[id number]. Topic: Appointments - Scheduling Inquiry for Clinic >> Oct 06, 2024 12:27 PM [name] wrote: Reason for CRM:  Patient called in to inquire about his Annual Cancer Screening.   He wanted to know if this is something the clinic will schedule or if this is a visit that he will have to schedule on his time.   Please Advise <3

## 2024-10-06 NOTE — Telephone Encounter (Signed)
 What cancer screening is this question about?  Thanks

## 2024-10-07 NOTE — Telephone Encounter (Signed)
 Pt states he is referring to a colonoscopy. Pt states he has not had one in 10 years.

## 2024-10-12 NOTE — Telephone Encounter (Signed)
 Okay.  Will do.  He saw Dr. Legrand in 2025.

## 2024-10-12 NOTE — Addendum Note (Signed)
 Addended by: Shonica Weier V on: 10/12/2024 10:50 PM   Modules accepted: Orders

## 2024-10-25 NOTE — Progress Notes (Unsigned)
 "     Wayne Console, PA-C [address] [place], [place]  [address] Phone: [phone]   Primary Care Physician: Garald Karlynn GAILS, MD  Primary Gastroenterologist:  Wayne Console, PA-C / Lupita Commander, MD   Chief Complaint: Constipation, rectal discomfort, hemorrhoids, repeat colonoscopy      HPI:   Discussed the use of AI scribe software for clinical note transcription with the patient, who gave verbal consent to proceed.  68 year old male, previous patient of Dr. Teressa and transferred care to Dr. Enid.  Patient underwent internal hemorrhoid banding by Dr. Commander 01/2024.  History of intermittent constipation.  Treated with MiraLAX in the past.  Patient is not currently taking MiraLAX or any other treatment for constipation.  History of Present Illness Constipation - Intermittent constipation over the past several months - Symptoms are occasional, not constant - Constipation associated with taking vitamins for his eyes - Has used Align a few times for relief - Has not used Miralax - Does not take iron supplements  Rectal discomfort and hemorrhoidal symptoms - Rectal discomfort characterized by sensation of swelling and pain, especially during bowel movements and when sitting - Discomfort absent in the mornings, develops and worsens throughout the day - Uncertain if symptoms are related to recurrence of hemorrhoids - History of hemorrhoid banding in March 2025 - No visible blood in stool, though possibly saw a small spot once or twice - Rare mild, crampy abdominal pain  10/2014 last screening colonoscopy: Pandiverticulosis.  No polyps.  10-year repeat (due 10/2024).  Current Outpatient Medications  Medication Sig Dispense Refill   amoxicillin (AMOXIL) 500 MG tablet      cholecalciferol (VITAMIN D3) 25 MCG (1000 UNIT) tablet Take 1,000 Units by mouth daily.     clotrimazole -betamethasone  (LOTRISONE ) cream Apply 1 Application topically daily. 45 g 2   hydrocortisone   2.5 % cream Apply topically 3 (three) times daily. As directed 60 g 2   ibuprofen  (ADVIL ) 600 MG tablet Take 1 tablet (600 mg total) by mouth every 6 (six) hours as needed for mild pain or moderate pain. 30 tablet 0   Multiple Vitamins-Minerals (ICAPS AREDS 2 PO)      Na Sulfate-K Sulfate-Mg Sulfate concentrate (SUPREP) 17.5-3.13-1.6 GM/177ML SOLN Take 1 kit (354 mLs total) by mouth once for 1 dose. 354 mL 0   Omega-3 Fatty Acids (FISH OIL ) 1000 MG CAPS Take 2 capsules (2,000 mg total) by mouth daily. 100 capsule 3   senna-docusate (SENOKOT-S) 8.6-50 MG tablet Take 2 tablets by mouth at bedtime. 180 tablet 3   hydrocortisone  (ANUSOL -HC) 25 MG suppository Place 1 suppository (25 mg total) rectally daily. 12 suppository 1   No current facility-administered medications for this visit.    Allergies as of 10/26/2024   (No Known Allergies)    Past Medical History:  Diagnosis Date   Arthritis    Hyperlipidemia    slightly elevated, no meds   Varicose veins     Past Surgical History:  Procedure Laterality Date   CATARACT EXTRACTION Right    ENDOVENOUS ABLATION SAPHENOUS VEIN W/ LASER Left 08/10/2009   left greater saphenous vein   ENDOVENOUS ABLATION SAPHENOUS VEIN W/ LASER Left 10/08/2013   EVLA left proximal greater saphenous vein and stab phlebectomy 10-20 incisions left leg by Krystal Doing MD   HEMORRHOID BANDING  01/14/2024   REPLACEMENT TOTAL HIP W/  RESURFACING IMPLANTS Left    VASECTOMY      Review of Systems:    All systems reviewed and  negative except where noted in HPI.    Physical Exam:  BP 112/60 (BP Location: Right Arm, Patient Position: Sitting, Cuff Size: Normal)   Pulse 90   Ht 6' 2 (1.88 m) Comment: per patient  Wt 213 lb 4 oz (96.7 kg)   BMI 27.38 kg/m  No LMP for male patient.  General: Well-nourished, well-developed in no acute distress.  Lungs: Clear to auscultation bilaterally. Non-labored. Heart: Regular rate and rhythm, no murmurs rubs or gallops.   Abdomen: Bowel sounds are normal; Abdomen is Soft; No hepatosplenomegaly, masses or hernias;  No Abdominal Tenderness; No guarding or rebound tenderness. Neuro: Alert and oriented x 3.  Grossly intact.  Psych: Alert and cooperative, normal mood and affect. Rectal: No external hemorrhoids.  No rashes or lesions.  No tenderness.  No evidence of anal fissure.  Normal digital rectal exam.  No tenderness or masses.  Stool is brown and Hemoccult negative.  Chaperone for Exam:  Alethea Blocker, CMA    Imaging Studies: No results found.  Labs: CBC    Component Value Date/Time   WBC 6.7 04/02/2024 0937   RBC 4.44 04/02/2024 0937   HGB 13.4 04/02/2024 0937   HCT 40.0 04/02/2024 0937   PLT 234.0 04/02/2024 0937   MCV 90.0 04/02/2024 0937   MCHC 33.5 04/02/2024 0937   RDW 12.9 04/02/2024 0937   LYMPHSABS 1.5 04/02/2024 0937   MONOABS 0.7 04/02/2024 0937   EOSABS 0.2 04/02/2024 0937   BASOSABS 0.1 04/02/2024 0937    CMP     Component Value Date/Time   NA 138 04/02/2024 0937   K 4.4 04/02/2024 0937   CL 101 04/02/2024 0937   CO2 30 04/02/2024 0937   GLUCOSE 88 04/02/2024 0937   BUN 14 04/02/2024 0937   CREATININE 0.82 04/02/2024 0937   CALCIUM 10.2 04/02/2024 0937   PROT 6.8 04/02/2024 0937   ALBUMIN 4.7 04/02/2024 0937   AST 21 04/02/2024 0937   ALT 17 04/02/2024 0937   ALKPHOS 60 04/02/2024 0937   BILITOT 0.6 04/02/2024 9062     Assessment and Plan:   Wayne [name] is a 68 y.o. y/o male presents for:  Internal Hemorrhoids Chronic Constipation Colon Cancer Screening: Due for 10-year repeat colonoscopy  Plan: - Start Rx Sonokot-S 50mg  / 8.6mg , Take 2 tablets once daily at bedtime. - Rx Hydrocortisone  25mg  at bedtime x 12 days, 1 RF - Stressed the importance of treating constipation to prevent recurrent hemorrhoids. - Scheduling Colonoscopy I discussed risks of colonoscopy with patient to include risk of bleeding, colon perforation, and risk of sedation.  Patient  expressed understanding and agrees to proceed with colonoscopy.  - Reassurance regarding normal rectal exam today.   Wayne Console, PA-C  Follow up based on colonoscopy results and GI symptoms.   "

## 2024-10-26 ENCOUNTER — Encounter: Payer: Self-pay | Admitting: Physician Assistant

## 2024-10-26 ENCOUNTER — Ambulatory Visit: Admitting: Physician Assistant

## 2024-10-26 VITALS — BP 112/60 | HR 90 | Ht 74.0 in | Wt 213.2 lb

## 2024-10-26 DIAGNOSIS — K5904 Chronic idiopathic constipation: Secondary | ICD-10-CM

## 2024-10-26 DIAGNOSIS — Z1211 Encounter for screening for malignant neoplasm of colon: Secondary | ICD-10-CM

## 2024-10-26 DIAGNOSIS — K648 Other hemorrhoids: Secondary | ICD-10-CM

## 2024-10-26 DIAGNOSIS — K5909 Other constipation: Secondary | ICD-10-CM | POA: Diagnosis not present

## 2024-10-26 MED ORDER — HYDROCORTISONE ACETATE 25 MG RE SUPP: 25.0000 mg | Freq: Every day | RECTAL | 1 refills | Status: DC

## 2024-10-26 MED ORDER — SENNOSIDES-DOCUSATE SODIUM 8.6-50 MG PO TABS: 2.0000 | ORAL_TABLET | Freq: Every day | ORAL | 3 refills | Status: AC

## 2024-10-26 MED ORDER — NA SULFATE-K SULFATE-MG SULF 17.5-3.13-1.6 GM/177ML PO SOLN: 1.0000 | Freq: Once | ORAL | 0 refills | Status: AC

## 2024-10-26 NOTE — Patient Instructions (Addendum)
 We have sent the following medications to your pharmacy for you to pick up at your convenience: Senokot-S take 2 tablets once daily at bedtime and Hydrocortisone  Suppositories 25 mg once daily at bedtime for 12 days.  You have been scheduled for a Colonoscopy. Please follow written instructions given to you at your visit today.   If you use inhalers (even only as needed), please bring them with you on the day of your procedure.  DO NOT TAKE 7 DAYS PRIOR TO TEST- Trulicity (dulaglutide) Ozempic, Wegovy (semaglutide) Mounjaro (tirzepatide) Bydureon Bcise (exanatide extended release)  DO NOT TAKE 1 DAY PRIOR TO YOUR TEST Rybelsus (semaglutide) Adlyxin (lixisenatide) Victoza (liraglutide) Byetta (exanatide) ___________________________________________________________________________  Please follow up sooner if symptoms increase or worsen   Due to recent changes in healthcare laws, you may see the results of your imaging and laboratory studies on MyChart before your provider has had a chance to review them.  We understand that in some cases there may be results that are confusing or concerning to you. Not all laboratory results come back in the same time frame and the provider may be waiting for multiple results in order to interpret others.  Please give us  48 hours in order for your provider to thoroughly review all the results before contacting the office for clarification of your results.   Thank you for trusting me with your gastrointestinal care!   Ellouise Console, PA-C _______________________________________________________  If your blood pressure at your visit was 140/90 or greater, please contact your primary care physician to follow up on this.  _______________________________________________________  If you are age 66 or older, your body mass index should be between 23-30. Your Body mass index is 27.38 kg/m. If this is out of the aforementioned range listed, please consider follow  up with your Primary Care Provider.  If you are age 66 or younger, your body mass index should be between 19-25. Your Body mass index is 27.38 kg/m. If this is out of the aformentioned range listed, please consider follow up with your Primary Care Provider.   ________________________________________________________  The [facility] providers would like to encourage you to use MYCHART to communicate with providers for non-urgent requests or questions.  Due to long hold times on the telephone, sending your provider a message by [facility] may be a faster and more efficient way to get a response.  Please allow 48 business hours for a response.  Please remember that this is for non-urgent requests.  _______________________________________________________

## 2024-11-11 ENCOUNTER — Encounter: Payer: Self-pay | Admitting: Internal Medicine

## 2024-11-11 ENCOUNTER — Ambulatory Visit: Admitting: Internal Medicine

## 2024-11-11 VITALS — BP 127/77 | HR 66 | Temp 97.5°F | Resp 13 | Ht 74.0 in | Wt 213.0 lb

## 2024-11-11 DIAGNOSIS — K648 Other hemorrhoids: Secondary | ICD-10-CM

## 2024-11-11 DIAGNOSIS — K5730 Diverticulosis of large intestine without perforation or abscess without bleeding: Secondary | ICD-10-CM

## 2024-11-11 DIAGNOSIS — Z1211 Encounter for screening for malignant neoplasm of colon: Secondary | ICD-10-CM

## 2024-11-11 MED ORDER — SODIUM CHLORIDE 0.9 % IV SOLN: 500.0000 mL | Freq: Once | INTRAVENOUS | Status: DC

## 2024-11-11 NOTE — Progress Notes (Signed)
 To pacu, VSS. Report to [email or web address]

## 2024-11-11 NOTE — Progress Notes (Signed)
 History and Physical Interval Note:  11/11/2024 4:05 PM  Wayne [name]  has presented today for endoscopic procedure(s), with the diagnosis of  Encounter Diagnosis  Name Primary?   Colon cancer screening Yes  .  The various methods of evaluation and treatment have been discussed with the patient and/or family. After consideration of risks, benefits and other options for treatment, the patient has consented to  the endoscopic procedure(s).   The patient's history has been reviewed, patient examined, no change in status, stable for endoscopic procedure(s).  I have reviewed the patient's chart and labs.  Questions were answered to the patient's satisfaction.     Lupita CHARLENA Commander, MD, [facility]

## 2024-11-11 NOTE — Patient Instructions (Addendum)
 I saw some mildly swollen hemorrhoids, scars from the banding I did, and diverticulosis.  Nothing obvious as to why you have your symptoms.  I think using the hydrocortisone  suppositories nightly for a while would help, like 1 week at a time and also taking MiraLAX every day.  I am not convinced repeat banding would make a difference at this juncture.  If you fail to improve you can come back and see me, you could message me and we could direct you to see me in the office.  A colonoscopy prep was okay after a lot of of flushing.  I am recommending you repeat an exam in 5 years instead of 10.  I appreciate the opportunity care for you.  Lupita CHARLENA Commander, MD, [facility]   Continue present medications. Repeat colonoscopy in 5 years. (extensive lavage used to obtain adequate prep)  YOU HAD AN ENDOSCOPIC PROCEDURE TODAY AT THE [facility]:   Refer to the procedure report that was given to you for any specific questions about what was found during the examination.  If the procedure report does not answer your questions, please call your gastroenterologist to clarify.  If you requested that your care partner not be given the details of your procedure findings, then the procedure report has been included in a sealed envelope for you to review at your convenience later.  YOU SHOULD EXPECT: Some feelings of bloating in the abdomen. Passage of more gas than usual.  Walking can help get rid of the air that was put into your GI tract during the procedure and reduce the bloating. If you had a lower endoscopy (such as a colonoscopy or flexible sigmoidoscopy) you may notice spotting of blood in your stool or on the toilet paper. If you underwent a bowel prep for your procedure, you may not have a normal bowel movement for a few days.  Please Note:  You might notice some irritation and congestion in your nose or some drainage.  This is from the oxygen used during your procedure.  There is no need for concern  and it should clear up in a day or so.  SYMPTOMS TO REPORT IMMEDIATELY:  Following lower endoscopy (colonoscopy or flexible sigmoidoscopy):  Excessive amounts of blood in the stool  Significant tenderness or worsening of abdominal pains  Swelling of the abdomen that is new, acute  Fever of 100F or higher  For urgent or emergent issues, a gastroenterologist can be reached at any hour by calling [phone]. Do not use MyChart messaging for urgent concerns.    DIET:  We do recommend a small meal at first, but then you may proceed to your regular diet.  Drink plenty of fluids but you should avoid alcoholic beverages for 24 hours.  ACTIVITY:  You should plan to take it easy for the rest of today and you should NOT DRIVE or use heavy machinery until tomorrow (because of the sedation medicines used during the test).    FOLLOW UP: Our staff will call the number listed on your records the next business day following your procedure.  We will call around 7:15- 8:00 am to check on you and address any questions or concerns that you may have regarding the information given to you following your procedure. If we do not reach you, we will leave a message.      SIGNATURES/CONFIDENTIALITY: You and/or your care partner have signed paperwork which will be entered into your electronic medical record.  These signatures attest  to the fact that that the information above on your After Visit Summary has been reviewed and is understood.  Full responsibility of the confidentiality of this discharge information lies with you and/or your care-partner.

## 2024-11-11 NOTE — Progress Notes (Signed)
 Pt's states no medical or surgical changes since previsit or office visit.

## 2024-11-11 NOTE — Op Note (Signed)
 [facility] Patient Name: Wayne [name] Procedure Date: 11/11/2024 3:52 PM MRN: [id number] Endoscopist: Lupita FORBES Commander , MD, [id number] Age: 69 Referring MD:  Date of Birth: 01-01-1956 Gender: Male Account #: [id number] Procedure:                Colonoscopy Indications:              Screening for colorectal malignant neoplasm, Last                            colonoscopy: 2015 Medicines:                Monitored Anesthesia Care Procedure:                Pre-Anesthesia Assessment:                           - Prior to the procedure, a History and Physical                            was performed, and patient medications and                            allergies were reviewed. The patient's tolerance of                            previous anesthesia was also reviewed. The risks                            and benefits of the procedure and the sedation                            options and risks were discussed with the patient.                            All questions were answered, and informed consent                            was obtained. Prior Anticoagulants: The patient has                            taken no anticoagulant or antiplatelet agents. ASA                            Grade Assessment: II - A patient with mild systemic                            disease. After reviewing the risks and benefits,                            the patient was deemed in satisfactory condition to                            undergo the procedure.  After obtaining informed consent, the colonoscope                            was passed under direct vision. Throughout the                            procedure, the patient's blood pressure, pulse, and                            oxygen saturations were monitored continuously. The                            CF [id number] #[id number] was introduced through the anus                            and advanced to the the cecum, identified  by                            appendiceal orifice and ileocecal valve. The                            colonoscopy was somewhat difficult due to adherent                            stool. Successful completion of the procedure was                            aided by lavage. The patient tolerated the                            procedure well. The quality of the bowel                            preparation was adequate. The ileocecal valve,                            appendiceal orifice, and rectum were photographed.                            The bowel preparation used was SUPREP via split                            dose instruction. Scope In: 4:13:56 PM Scope Out: 4:29:41 PM Scope Withdrawal Time: 0 hours 12 minutes 32 seconds  Total Procedure Duration: 0 hours 15 minutes 45 seconds  Findings:                 The perianal and digital rectal examinations were                            normal. Pertinent negatives include normal prostate                            (size, shape, and consistency).  Multiple diverticula were found in the sigmoid                            colon, descending colon, transverse colon and                            ascending colon.                           Internal hemorrhoids were found.                           post banding scars were found in the rectum.                           The exam was otherwise without abnormality on                            direct and retroflexion views. Complications:            No immediate complications. Estimated Blood Loss:     Estimated blood loss: none. Impression:               - Diverticulosis in the sigmoid colon, in the                            descending colon, in the transverse colon and in                            the ascending colon.                           - Internal hemorrhoids.                           - Scar in the rectum.                           - The examination was otherwise  normal on direct                            and retroflexion views.                           - No specimens collected. Recommendation:           - Patient has a contact number available for                            emergencies. The signs and symptoms of potential                            delayed complications were discussed with the                            patient. Return to normal activities tomorrow.  Written discharge instructions were provided to the                            patient.                           - Resume previous diet.                           - Continue present medications.                           - Repeat colonoscopy in 5 years. (extensive lavage                            used to obtain adequate prep) Lupita FORBES Commander, MD 11/11/2024 4:39:34 PM This report has been signed electronically.

## 2024-11-12 ENCOUNTER — Telehealth: Payer: Self-pay | Admitting: *Deleted

## 2024-11-12 NOTE — Telephone Encounter (Signed)
 No answer on follow up call. Left message.

## 2024-12-02 ENCOUNTER — Encounter: Payer: Self-pay | Admitting: Internal Medicine

## 2024-12-02 ENCOUNTER — Telehealth: Payer: Self-pay | Admitting: *Deleted

## 2024-12-02 DIAGNOSIS — K648 Other hemorrhoids: Secondary | ICD-10-CM

## 2024-12-02 MED ORDER — HYDROCORTISONE ACETATE 25 MG RE SUPP: 25.0000 mg | Freq: Every day | RECTAL | 0 refills | Status: AC

## 2024-12-02 NOTE — Telephone Encounter (Signed)
 Received mychart message from patient requesting refill for anusol -hc suppositories.  Patient s/p hemorrhoid banding, improving but still having discomfort.  Advised they are meant for short term use.  Refilled once with no refills, advised patient to call and make appointment if he is still having discomfort when he finishes this current prescription. Patient voiced understanding.

## 2024-12-07 ENCOUNTER — Encounter: Payer: Self-pay | Admitting: Gastroenterology

## 2024-12-09 ENCOUNTER — Ambulatory Visit: Admitting: Physician Assistant
